# Patient Record
Sex: Male | Born: 1985 | Race: Black or African American | Hispanic: No | Marital: Married | Smoking: Never smoker
Health system: Southern US, Community
[De-identification: ages and names within clinical notes are randomized; demographics above are authoritative.]

## PROBLEM LIST (undated history)

## (undated) DIAGNOSIS — J45909 Unspecified asthma, uncomplicated: Secondary | ICD-10-CM

## (undated) DIAGNOSIS — I820 Budd-Chiari syndrome: Secondary | ICD-10-CM

## (undated) HISTORY — DX: Unspecified asthma, uncomplicated: J45.909

---

## 2013-05-13 ENCOUNTER — Encounter (HOSPITAL_COMMUNITY): Payer: Self-pay | Admitting: Emergency Medicine

## 2013-05-13 ENCOUNTER — Inpatient Hospital Stay (HOSPITAL_COMMUNITY)
Admission: EM | Admit: 2013-05-13 | Discharge: 2013-05-16 | DRG: 442 | Disposition: A | Discharge status: Home / Self Care | Payer: BC Managed Care – PPO | Attending: Family Medicine | Admitting: Family Medicine

## 2013-05-13 DIAGNOSIS — R1013 Epigastric pain: Secondary | ICD-10-CM

## 2013-05-13 DIAGNOSIS — E669 Obesity, unspecified: Secondary | ICD-10-CM | POA: Diagnosis present

## 2013-05-13 DIAGNOSIS — R109 Unspecified abdominal pain: Secondary | ICD-10-CM

## 2013-05-13 DIAGNOSIS — Z6841 Body Mass Index (BMI) 40.0 and over, adult: Secondary | ICD-10-CM

## 2013-05-13 DIAGNOSIS — I81 Portal vein thrombosis: Principal | ICD-10-CM | POA: Diagnosis present

## 2013-05-13 DIAGNOSIS — Z79899 Other long term (current) drug therapy: Secondary | ICD-10-CM

## 2013-05-13 DIAGNOSIS — I820 Budd-Chiari syndrome: Secondary | ICD-10-CM | POA: Diagnosis present

## 2013-05-13 LAB — CBC WITH DIFFERENTIAL/PLATELET
Basophils Absolute: 0 10*3/uL (ref 0.0–0.1)
Eosinophils Absolute: 0.2 10*3/uL (ref 0.0–0.7)
Eosinophils Relative: 2 % (ref 0–5)
Lymphs Abs: 2.9 10*3/uL (ref 0.7–4.0)
MCH: 31.5 pg (ref 26.0–34.0)
MCV: 89 fL (ref 78.0–100.0)
Neutro Abs: 4.8 10*3/uL (ref 1.7–7.7)
Neutrophils Relative %: 55 % (ref 43–77)
Platelets: 224 10*3/uL (ref 150–400)
RBC: 5.17 MIL/uL (ref 4.22–5.81)
RDW: 13.2 % (ref 11.5–15.5)
WBC: 8.8 10*3/uL (ref 4.0–10.5)

## 2013-05-13 LAB — URINALYSIS, ROUTINE W REFLEX MICROSCOPIC
Bilirubin Urine: NEGATIVE
Nitrite: NEGATIVE
Protein, ur: NEGATIVE mg/dL
Specific Gravity, Urine: 1.023 (ref 1.005–1.030)
Urobilinogen, UA: 0.2 mg/dL (ref 0.0–1.0)

## 2013-05-13 LAB — COMPREHENSIVE METABOLIC PANEL
ALT: 22 U/L (ref 0–53)
AST: 33 U/L (ref 0–37)
Albumin: 3.9 g/dL (ref 3.5–5.2)
Alkaline Phosphatase: 90 U/L (ref 39–117)
Calcium: 9.5 mg/dL (ref 8.4–10.5)
Glucose, Bld: 99 mg/dL (ref 70–99)
Potassium: 4.4 mEq/L (ref 3.5–5.1)
Sodium: 135 mEq/L (ref 135–145)
Total Bilirubin: 0.7 mg/dL (ref 0.3–1.2)
Total Protein: 8.3 g/dL (ref 6.0–8.3)

## 2013-05-13 LAB — LIPASE, BLOOD: Lipase: 49 U/L (ref 11–59)

## 2013-05-13 NOTE — ED Notes (Signed)
The pt is c/o abd pain for 3 days no n v or diarrhea

## 2013-05-13 NOTE — ED Provider Notes (Signed)
CSN: [id number]     Arrival date & time 05/13/13  2224 History   First MD Initiated Contact with Patient 05/13/13 2340     Chief Complaint  Patient presents with  . Abdominal Pain   (Consider location/radiation/quality/duration/timing/severity/associated sxs/prior Treatment) Patient is a 27 y.o. male presenting with abdominal pain. The history is provided by the patient and medical records. No language interpreter was used.  Abdominal Pain Associated symptoms: no chest pain, no constipation, no cough, no diarrhea, no dysuria, no fatigue, no fever, no hematuria, no nausea, no shortness of breath and no vomiting     LUCIA MCCREADIE is a 27 y.o. male  with no known medical or surgical hx presents to the Emergency Department complaining of gradual, persistent, progressively worsening abdominal pain onset 3 days ago. Associated symptoms include early satiety.  Patient has not attempted any over-the-counter treatments but lying flat makes it better and sitting and walking makes it worse.  Pt denies fever, chills, headache, neck pain, chest pain, SOB, nausea, vomiting, diarrhea, weakness, dizziness, syncope, dysuria, hematuria, testicular pain, penile pain or penile discharge. Patient denies change in eating habits; no sick contacts.   History reviewed. No pertinent past medical history. History reviewed. No pertinent past surgical history. No family history on file. History  Substance Use Topics  . Smoking status: Never Smoker   . Smokeless tobacco: Not on file  . Alcohol Use: No    Review of Systems  Constitutional: Negative for fever, diaphoresis, appetite change, fatigue and unexpected weight change.  HENT: Negative for mouth sores and trouble swallowing.   Respiratory: Negative for cough, chest tightness, shortness of breath, wheezing and stridor.   Cardiovascular: Negative for chest pain and palpitations.  Gastrointestinal: Positive for abdominal pain. Negative for nausea,  vomiting, diarrhea, constipation, blood in stool, abdominal distention and rectal pain.  Genitourinary: Negative for dysuria, urgency, frequency, hematuria, flank pain and difficulty urinating.  Musculoskeletal: Negative for back pain, neck pain and neck stiffness.  Skin: Negative for rash.  Neurological: Negative for weakness.  Hematological: Negative for adenopathy.  Psychiatric/Behavioral: Negative for confusion.  All other systems reviewed and are negative.    Allergies  Review of patient's allergies indicates no known allergies.  Home Medications   Current Outpatient Rx  Name  Route  Sig  Dispense  Refill  . omeprazole (PRILOSEC) 20 MG capsule   Oral   Take 1 capsule (20 mg total) by mouth daily.   30 capsule   0    BP 123/75  Pulse 85  Temp(Src) 98.2 F (36.8 C) (Oral)  Resp 15  Wt 314 lb 8 oz (142.656 kg)  SpO2 99% Physical Exam  Nursing note and vitals reviewed. Constitutional: He appears well-developed and well-nourished. No distress.  Awake, alert, nontoxic appearance  HENT:  Head: Normocephalic and atraumatic.  Mouth/Throat: Oropharynx is clear and moist. No oropharyngeal exudate.  Eyes: Conjunctivae are normal. No scleral icterus.  Neck: Normal range of motion. Neck supple.  Cardiovascular: Normal rate, regular rhythm, normal heart sounds and intact distal pulses.   No murmur heard. No tachycardia  Pulmonary/Chest: Effort normal and breath sounds normal. No respiratory distress. He has no wheezes.  Abdominal: Soft. Bowel sounds are normal. He exhibits no distension and no mass. There is tenderness in the epigastric area. There is no rebound, no guarding and no CVA tenderness.  Mild epigastric tenderness No rebound, guarding or peritoneal signs No pain with heel tap  Musculoskeletal: Normal range of motion. He exhibits no edema.  Neurological: He is alert.  Speech is clear and goal oriented Moves extremities without ataxia  Skin: Skin is warm and dry.  He is not diaphoretic.  Psychiatric: He has a normal mood and affect.    ED Course  Procedures (including critical care time) Labs Review Labs Reviewed  URINALYSIS, ROUTINE W REFLEX MICROSCOPIC - Abnormal; Notable for the following:    Color, Urine AMBER (*)    Ketones, ur 15 (*)    All other components within normal limits  CBC WITH DIFFERENTIAL  COMPREHENSIVE METABOLIC PANEL  LIPASE, BLOOD   Imaging Review No results found.  EKG Interpretation   None       MDM   1. Epigastric pain      Gianfranco N Waner presents with several days of epigastric pain.  On PE pt with mild ttp in the epigastrium but negative Murphy's sign.  Pt reports tolerating PO without difficulty and no emesis in the department.  Pt denies Hx of GERD or reflux symptoms.    12:53 AM Pt resting comfortably.  CT pending to rule out emergent condition.  Unlikely biliary in nature.  Pt declines pain control at this time.  Labs and PE reassuring.    Discussed with Fayrene Helper, PA-C who will follow and dispo.    Dahlia Client Ravyn Nikkel, PA-C 05/14/13 [phone]

## 2013-05-14 ENCOUNTER — Encounter (HOSPITAL_COMMUNITY): Payer: Self-pay | Admitting: Radiology

## 2013-05-14 ENCOUNTER — Other Ambulatory Visit: Payer: Self-pay | Admitting: Hematology and Oncology

## 2013-05-14 ENCOUNTER — Emergency Department (HOSPITAL_COMMUNITY): Payer: BC Managed Care – PPO

## 2013-05-14 DIAGNOSIS — I81 Portal vein thrombosis: Principal | ICD-10-CM | POA: Diagnosis present

## 2013-05-14 DIAGNOSIS — R1013 Epigastric pain: Secondary | ICD-10-CM

## 2013-05-14 DIAGNOSIS — R109 Unspecified abdominal pain: Secondary | ICD-10-CM | POA: Diagnosis present

## 2013-05-14 DIAGNOSIS — I820 Budd-Chiari syndrome: Secondary | ICD-10-CM

## 2013-05-14 LAB — HEPARIN LEVEL (UNFRACTIONATED): Heparin Unfractionated: 0.13 IU/mL — ABNORMAL LOW (ref 0.30–0.70)

## 2013-05-14 LAB — HOMOCYSTEINE: Homocysteine: 10.5 umol/L (ref 4.0–15.4)

## 2013-05-14 LAB — PROTIME-INR: INR: 1.03 (ref 0.00–1.49)

## 2013-05-14 MED ORDER — OMEPRAZOLE 20 MG PO CPDR: 20.0000 mg | DELAYED_RELEASE_CAPSULE | Freq: Every day | ORAL | Status: DC

## 2013-05-14 MED ORDER — HEPARIN BOLUS VIA INFUSION
6000.0000 [IU] | Freq: Once | INTRAVENOUS | Status: AC
  Administered 2013-05-14: 6000 [IU] via INTRAVENOUS
  Filled 2013-05-14: qty 6000

## 2013-05-14 MED ORDER — MORPHINE SULFATE 2 MG/ML IJ SOLN
2.0000 mg | INTRAMUSCULAR | Status: DC | PRN
  Administered 2013-05-14 (×3): 2 mg via INTRAVENOUS
  Filled 2013-05-14 (×3): qty 1

## 2013-05-14 MED ORDER — IOHEXOL 300 MG/ML  SOLN
100.0000 mL | Freq: Once | INTRAMUSCULAR | Status: AC | PRN
  Administered 2013-05-14: 100 mL via INTRAVENOUS

## 2013-05-14 MED ORDER — DEXTROSE-NACL 5-0.9 % IV SOLN
INTRAVENOUS | Status: DC
  Administered 2013-05-14 – 2013-05-15 (×4): via INTRAVENOUS

## 2013-05-14 MED ORDER — ONDANSETRON HCL 4 MG PO TABS: 4.0000 mg | ORAL_TABLET | Freq: Four times a day (QID) | ORAL | Status: DC | PRN

## 2013-05-14 MED ORDER — HEPARIN (PORCINE) IN NACL 100-0.45 UNIT/ML-% IJ SOLN
2000.0000 [IU]/h | INTRAMUSCULAR | Status: DC
  Administered 2013-05-14 (×2): 1600 [IU]/h via INTRAVENOUS
  Filled 2013-05-14 (×3): qty 250

## 2013-05-14 MED ORDER — ONDANSETRON HCL 4 MG/2ML IJ SOLN: 4.0000 mg | Freq: Four times a day (QID) | INTRAMUSCULAR | Status: DC | PRN

## 2013-05-14 MED ORDER — HEPARIN BOLUS VIA INFUSION
3000.0000 [IU] | Freq: Once | INTRAVENOUS | Status: AC
  Administered 2013-05-14: 3000 [IU] via INTRAVENOUS
  Filled 2013-05-14: qty 3000

## 2013-05-14 NOTE — Consult Note (Signed)
[facility] CONSULT NOTE CHIEF COMPLAINTS/PURPOSE OF CONSULTATION:  Acute portal vein thrombosis  HISTORY OF PRESENTING ILLNESS:  Ninfa Meeker Hoadley 27 y.o. male is here because of severe abdominal pain and was subsequently found to have portal vein thrombosis  This is an otherwise healthy gentleman was admitted after several days of mid epigastric to right upper quadrant pain. He felt that the pain might have been worse after a meal. He denies any change in bowel habits. No recent weight loss. He was subsequently seen in the emergency department and had CT scan evaluation which show evidence of left portal vein thrombosis. He is being admitted for anticoagulation therapy.  He denies recent history of trauma, long distance travel, dehydration, recent surgery, smoking or prolonged immobilization. He had no prior history or diagnosis of cancer. His age appropriate screening programs are up-to-date. He never had prior surgeries before. He denies prior history of pancreatitis The patient had never receive testosterone replacement therapy. There is no family history of blood clots.  MEDICAL HISTORY:  History reviewed. No pertinent past medical history.  SURGICAL HISTORY: History reviewed. No pertinent past surgical history.  SOCIAL HISTORY: History   Social History  . Marital Status: Married    Spouse Name: N/A    Number of Children: N/A  . Years of Education: N/A   Occupational History  . Not on file.   Social History Main Topics  . Smoking status: Never Smoker   . Smokeless tobacco: Not on file  . Alcohol Use: No  . Drug Use: Not on file  . Sexual Activity: Not on file   Other Topics Concern  . Not on file   Social History Narrative  . No narrative on file    FAMILY HISTORY: Denies family history of cancer or thrombosis ALLERGIES:  has No Known Allergies.  MEDICATIONS:  Current Facility-Administered Medications  Medication Dose Route Frequency  Provider Last Rate Last Dose  . dextrose 5 %-0.9 % sodium chloride infusion   Intravenous Continuous Houston Siren, MD 75 mL/hr at 05/14/13 0709    . heparin ADULT infusion 100 units/mL (25000 units/250 mL)  1,600 Units/hr Intravenous Continuous Abran Duke, RPH 16 mL/hr at 05/14/13 0743 1,600 Units/hr at 05/14/13 0743  . morphine 2 MG/ML injection 2 mg  2 mg Intravenous Q2H PRN Houston Siren, MD   2 mg at 05/14/13 1203  . ondansetron (ZOFRAN) tablet 4 mg  4 mg Oral Q6H PRN Houston Siren, MD       Or  . ondansetron [facility]) injection 4 mg  4 mg Intravenous Q6H PRN Houston Siren, MD        REVIEW OF SYSTEMS:   Constitutional: Denies fevers, chills or abnormal night sweats Eyes: Denies blurriness of vision, double vision or watery eyes Ears, nose, mouth, throat, and face: Denies mucositis or sore throat Respiratory: Denies cough, dyspnea or wheezes Cardiovascular: Denies palpitation, chest discomfort or lower extremity swelling Gastrointestinal:  Denies nausea, heartburn or change in bowel habits Skin: Denies abnormal skin rashes Lymphatics: Denies new lymphadenopathy or easy bruising Neurological:Denies numbness, tingling or new weaknesses Behavioral/Psych: Mood is stable, no new changes  All other systems were reviewed with the patient and are negative.  PHYSICAL EXAMINATION: ECOG PERFORMANCE STATUS: 1 - Symptomatic but completely ambulatory  Filed Vitals:   05/14/13 1515  BP: 121/82  Pulse: 74  Temp: 98.4 F (36.9 C)  Resp: 18   Filed Weights   05/13/13 2232 05/14/13 0653  Weight: 314 lb 8 oz (142.656  kg) 305 lb 5.4 oz (138.5 kg)    GENERAL:alert, no distress and comfortable SKIN: skin color, texture, turgor are normal, no rashes or significant lesions EYES: normal, conjunctiva are pink and non-injected, sclera clear OROPHARYNX:no exudate, no erythema and lips, buccal mucosa, and tongue normal  NECK: supple, thyroid normal size, non-tender, without nodularity LYMPH:  no palpable  lymphadenopathy in the cervical, axillary or inguinal LUNGS: clear to auscultation and percussion with normal breathing effort HEART: regular rate & rhythm and no murmurs and no lower extremity edema ABDOMEN:abdomen soft, mild tenderness in the epigastrium with normal bowel sounds Musculoskeletal:no cyanosis of digits and no clubbing  PSYCH: alert & oriented x 3 with fluent speech NEURO: no focal motor/sensory deficits  LABORATORY DATA:  I have reviewed the data as listed Recent Results (from the past 2160 hour(s))  CBC WITH DIFFERENTIAL     Status: None   Collection Time    05/13/13 10:32 PM      Result Value Range   WBC 8.8  4.0 - 10.5 K/uL   RBC 5.17  4.22 - 5.81 MIL/uL   Hemoglobin 16.3  13.0 - 17.0 g/dL   HCT 01.0  27.2 - 53.6 %   MCV 89.0  78.0 - 100.0 fL   MCH 31.5  26.0 - 34.0 pg   MCHC 35.4  30.0 - 36.0 g/dL   RDW 64.4  03.4 - 74.2 %   Platelets 224  150 - 400 K/uL   Neutrophils Relative % 55  43 - 77 %   Neutro Abs 4.8  1.7 - 7.7 K/uL   Lymphocytes Relative 33  12 - 46 %   Lymphs Abs 2.9  0.7 - 4.0 K/uL   Monocytes Relative 10  3 - 12 %   Monocytes Absolute 0.9  0.1 - 1.0 K/uL   Eosinophils Relative 2  0 - 5 %   Eosinophils Absolute 0.2  0.0 - 0.7 K/uL   Basophils Relative 0  0 - 1 %   Basophils Absolute 0.0  0.0 - 0.1 K/uL  COMPREHENSIVE METABOLIC PANEL     Status: None   Collection Time    05/13/13 10:32 PM      Result Value Range   Sodium 135  135 - 145 mEq/L   Potassium 4.4  3.5 - 5.1 mEq/L   Chloride 98  96 - 112 mEq/L   CO2 28  19 - 32 mEq/L   Glucose, Bld 99  70 - 99 mg/dL   BUN 8  6 - 23 mg/dL   Creatinine, Ser 5.95  0.50 - 1.35 mg/dL   Calcium 9.5  8.4 - 63.8 mg/dL   Total Protein 8.3  6.0 - 8.3 g/dL   Albumin 3.9  3.5 - 5.2 g/dL   AST 33  0 - 37 U/L   ALT 22  0 - 53 U/L   Alkaline Phosphatase 90  39 - 117 U/L   Total Bilirubin 0.7  0.3 - 1.2 mg/dL   GFR calc non Af Amer >90  >90 mL/min   GFR calc Af Amer >90  >90 mL/min   Comment: (NOTE)      The eGFR has been calculated using the CKD EPI equation.     This calculation has not been validated in all clinical situations.     eGFR's persistently <90 mL/min signify possible Chronic Kidney     Disease.  LIPASE, BLOOD     Status: None   Collection Time    05/13/13 10:32  PM      Result Value Range   Lipase 49  11 - 59 U/L  URINALYSIS, ROUTINE W REFLEX MICROSCOPIC     Status: Abnormal   Collection Time    05/13/13 10:36 PM      Result Value Range   Color, Urine AMBER (*) YELLOW   Comment: BIOCHEMICALS MAY BE AFFECTED BY COLOR   APPearance CLEAR  CLEAR   Specific Gravity, Urine 1.023  1.005 - 1.030   pH 5.5  5.0 - 8.0   Glucose, UA NEGATIVE  NEGATIVE mg/dL   Hgb urine dipstick NEGATIVE  NEGATIVE   Bilirubin Urine NEGATIVE  NEGATIVE   Ketones, ur 15 (*) NEGATIVE mg/dL   Protein, ur NEGATIVE  NEGATIVE mg/dL   Urobilinogen, UA 0.2  0.0 - 1.0 mg/dL   Nitrite NEGATIVE  NEGATIVE   Leukocytes, UA NEGATIVE  NEGATIVE   Comment: MICROSCOPIC NOT DONE ON URINES WITH NEGATIVE PROTEIN, BLOOD, LEUKOCYTES, NITRITE, OR GLUCOSE <1000 mg/dL.  PROTIME-INR     Status: None   Collection Time    05/14/13  5:21 AM      Result Value Range   Prothrombin Time 13.3  11.6 - 15.2 seconds   INR 1.03  0.00 - 1.49  ANTITHROMBIN III     Status: None   Collection Time    05/14/13  5:30 AM      Result Value Range   AntiThromb III Func 99  75 - 120 %  HOMOCYSTEINE     Status: None   Collection Time    05/14/13  5:30 AM      Result Value Range   Homocysteine 10.5  4.0 - 15.4 umol/L   Comment: Performed at [facility]    RADIOGRAPHIC STUDIES: I have personally reviewed the radiological images as listed and agreed with the findings in the report. Ct Abdomen Pelvis W Contrast  05/14/2013   CLINICAL DATA:  Abdominal pain for 3 days.  EXAM: CT ABDOMEN AND PELVIS WITH CONTRAST  TECHNIQUE: Multidetector CT imaging of the abdomen and pelvis was performed using the standard protocol following bolus  administration of intravenous contrast.  CONTRAST:  OMNIPAQUE IOHEXOL 300 MG/ML  SOLN  COMPARISON:  None.  FINDINGS: Minimal bibasilar atelectasis is noted.  There appears to be occlusion of the left portal vein, with associated inflammation and edema, and persistent increased enhancement of the left hepatic lobe. The right portal vein and remainder of the portal venous system appears intact. The liver is otherwise grossly unremarkable. The spleen is within normal limits.  The gallbladder is within normal limits. The pancreas and adrenal glands are unremarkable.  The kidneys are unremarkable in appearance. There is no evidence of hydronephrosis. No renal or ureteral stones are seen. No perinephric stranding is appreciated.  No free fluid is identified. The small bowel is unremarkable in appearance. The stomach is within normal limits. No acute vascular abnormalities are seen. Periaortic nodes are borderline normal in size. Two left-sided renal arteries are noted.  The appendix is normal in caliber, without evidence for appendicitis. The colon is unremarkable in appearance.  The bladder is mildly distended and grossly unremarkable. The prostate remains normal in size. No inguinal lymphadenopathy is seen.  No acute osseous abnormalities are identified.  IMPRESSION: 1. Occlusion of the left portal vein, with associated inflammation and edema, and persistent increased enhancement of the left hepatic lobe. 2. Otherwise unremarkable CT of the abdomen and pelvis.  These results were called by telephone at the time  of interpretation on 05/14/2013 at 2:29 AM to Dr. Ranae Palms, who verbally acknowledged these results.   Electronically Signed   By: Roanna Raider M.D.   On: 05/14/2013 02:34    ASSESSMENT:  Acute DVT  PLAN:  I reviewed with the patient about the plan for care for acute, unprovoked DVT.  We discussed about the pros and cons about testing for thrombophilia disorder. Workup is in progress. I will  add on a few additional tests to rule primary hematological disorders that can cause portal vein thrombosis  We discussed about various options of anticoagulation therapies including warfarin, low molecular weight heparin such as Lovenox or newer agents such as Rivaroxaban. Some of the risks and benefits discussed including costs involved, the need for monitoring, risks of life-threatening bleeding/hospitalization, reversibility of each agent in the event of bleeding or overdose, safety profile of each drug and taking into account other social issues such as ease of administration of medications, etc. Ultimately, we have made an informed decision for the patient to continue his treatment with IV heparin tonight and to switch over to oral anticoagulation therapy tomorrow.  The patient does not have a primary care provider. If he choose to go with warfarin, he could potentially be discharged with Lovenox injection for 5 days overlap with warfarin at 5 mg dose daily. I could bring him back to my clinic next Monday to have his INR checked and take care of his anticoagulation therapy. Goal INR is 2-3. If he elects to go with Rivaroxaban, he does not need anticoagulation monitoring and in that situation I will just see him back in the clinic in one month to followup on test results. The duration of recommended anticoagulation therapy is minimum 6 months.  Finally, at the end of our consultation today, I reinforced the importance of preventive strategies such as avoiding hormonal supplement, avoiding cigarette smoking, keeping up-to-date with screening programs for early cancer detection, frequent ambulation for long distance travel and aggressive DVT prophylaxis in all surgical settings.  I will make appointment for him to follow-up in my clinic in 1 month to review test results.  All questions were answered. The patient knows to call the clinic with any problems, questions or concerns.    Angelis Gates,  MD @T @ 6:42 PM

## 2013-05-14 NOTE — ED Provider Notes (Signed)
Epigastric pain, awaits CT to r/o acute emergent condition.  Labs are otherwise unremarkable.    3:55 AM abd CT with evidence of occlusion of the L portal vein with associated inflammation and edema, and persistent increased enhancement of the L hepatic lobe.    I discussed result with Dr. Ranae Palms.  I also consulted with [facility], Dr. Conley Rolls who will see and admit pt for further care.  Pt is aware of finding and agrees with plan.    BP 123/81  Pulse 71  Temp(Src) 97.9 F (36.6 C) (Oral)  Resp 14  Wt 314 lb 8 oz (142.656 kg)  SpO2 99%  I have reviewed nursing notes and vital signs. I personally reviewed the imaging tests through PACS system  I reviewed available ER/hospitalization records thought the EMR  Results for orders placed during the hospital encounter of 05/13/13  URINALYSIS, ROUTINE W REFLEX MICROSCOPIC      Result Value Range   Color, Urine AMBER (*) YELLOW   APPearance CLEAR  CLEAR   Specific Gravity, Urine 1.023  1.005 - 1.030   pH 5.5  5.0 - 8.0   Glucose, UA NEGATIVE  NEGATIVE mg/dL   Hgb urine dipstick NEGATIVE  NEGATIVE   Bilirubin Urine NEGATIVE  NEGATIVE   Ketones, ur 15 (*) NEGATIVE mg/dL   Protein, ur NEGATIVE  NEGATIVE mg/dL   Urobilinogen, UA 0.2  0.0 - 1.0 mg/dL   Nitrite NEGATIVE  NEGATIVE   Leukocytes, UA NEGATIVE  NEGATIVE  CBC WITH DIFFERENTIAL      Result Value Range   WBC 8.8  4.0 - 10.5 K/uL   RBC 5.17  4.22 - 5.81 MIL/uL   Hemoglobin 16.3  13.0 - 17.0 g/dL   HCT 28.4  13.2 - 44.0 %   MCV 89.0  78.0 - 100.0 fL   MCH 31.5  26.0 - 34.0 pg   MCHC 35.4  30.0 - 36.0 g/dL   RDW 10.2  72.5 - 36.6 %   Platelets 224  150 - 400 K/uL   Neutrophils Relative % 55  43 - 77 %   Neutro Abs 4.8  1.7 - 7.7 K/uL   Lymphocytes Relative 33  12 - 46 %   Lymphs Abs 2.9  0.7 - 4.0 K/uL   Monocytes Relative 10  3 - 12 %   Monocytes Absolute 0.9  0.1 - 1.0 K/uL   Eosinophils Relative 2  0 - 5 %   Eosinophils Absolute 0.2  0.0 - 0.7 K/uL   Basophils  Relative 0  0 - 1 %   Basophils Absolute 0.0  0.0 - 0.1 K/uL  COMPREHENSIVE METABOLIC PANEL      Result Value Range   Sodium 135  135 - 145 mEq/L   Potassium 4.4  3.5 - 5.1 mEq/L   Chloride 98  96 - 112 mEq/L   CO2 28  19 - 32 mEq/L   Glucose, Bld 99  70 - 99 mg/dL   BUN 8  6 - 23 mg/dL   Creatinine, Ser 4.40  0.50 - 1.35 mg/dL   Calcium 9.5  8.4 - 34.7 mg/dL   Total Protein 8.3  6.0 - 8.3 g/dL   Albumin 3.9  3.5 - 5.2 g/dL   AST 33  0 - 37 U/L   ALT 22  0 - 53 U/L   Alkaline Phosphatase 90  39 - 117 U/L   Total Bilirubin 0.7  0.3 - 1.2 mg/dL   GFR calc non Af Amer >  90  >90 mL/min   GFR calc Af Amer >90  >90 mL/min  LIPASE, BLOOD      Result Value Range   Lipase 49  11 - 59 U/L   Ct Abdomen Pelvis W Contrast  05/14/2013   CLINICAL DATA:  Abdominal pain for 3 days.  EXAM: CT ABDOMEN AND PELVIS WITH CONTRAST  TECHNIQUE: Multidetector CT imaging of the abdomen and pelvis was performed using the standard protocol following bolus administration of intravenous contrast.  CONTRAST:  OMNIPAQUE IOHEXOL 300 MG/ML  SOLN  COMPARISON:  None.  FINDINGS: Minimal bibasilar atelectasis is noted.  There appears to be occlusion of the left portal vein, with associated inflammation and edema, and persistent increased enhancement of the left hepatic lobe. The right portal vein and remainder of the portal venous system appears intact. The liver is otherwise grossly unremarkable. The spleen is within normal limits.  The gallbladder is within normal limits. The pancreas and adrenal glands are unremarkable.  The kidneys are unremarkable in appearance. There is no evidence of hydronephrosis. No renal or ureteral stones are seen. No perinephric stranding is appreciated.  No free fluid is identified. The small bowel is unremarkable in appearance. The stomach is within normal limits. No acute vascular abnormalities are seen. Periaortic nodes are borderline normal in size. Two left-sided renal arteries are noted.   The appendix is normal in caliber, without evidence for appendicitis. The colon is unremarkable in appearance.  The bladder is mildly distended and grossly unremarkable. The prostate remains normal in size. No inguinal lymphadenopathy is seen.  No acute osseous abnormalities are identified.  IMPRESSION: 1. Occlusion of the left portal vein, with associated inflammation and edema, and persistent increased enhancement of the left hepatic lobe. 2. Otherwise unremarkable CT of the abdomen and pelvis.  These results were called by telephone at the time of interpretation on 05/14/2013 at 2:29 AM to Dr. Ranae Palms, who verbally acknowledged these results.   Electronically Signed   By: Roanna Raider M.D.   On: 05/14/2013 02:34      Fayrene Helper, PA-C 05/14/13 [phone]

## 2013-05-14 NOTE — H&P (Signed)
[facility] History and Physical  JEFFREN DOMBEK ZOX:[id number] DOB: 1985/10/31    PCP:   NONE.  Chief Complaint: abdominal pain for 4 days.  HPI: Vincent Mckinney is an 27 y.o. male with benign past medical history on no chronic medication, presents to the ER with abdominal pain for 4 days.  It has not been severe, and not progressive.  Pain is constant and nonradiating, mainly in the epigastrium area. No melena.  He has had no nausea, vomiting, fever or chills.  BM has been normal. He has no distant travel, recent surgery, ill contact, trauma or previous abdominal surgery.  Evalution in the ER included a CT scan with contrast of the abdo/pelvis which showed a left hepatic vein thrombosis with associated left lobe edema.  His serology was normal including WBC, Hb, LFTs, renal fx test, and Lipase.  Hospitalist was asked to admit him for further evaluation and treatment.  Rewiew of Systems:  Constitutional: Negative for malaise, fever and chills. No significant weight loss or weight gain Eyes: Negative for eye pain, redness and discharge, diplopia, visual changes, or flashes of light. ENMT: Negative for ear pain, hoarseness, nasal congestion, sinus pressure and sore throat. No headaches; tinnitus, drooling, or problem swallowing. Cardiovascular: Negative for chest pain, palpitations, diaphoresis, dyspnea and peripheral edema. ; No orthopnea, PND Respiratory: Negative for cough, hemoptysis, wheezing and stridor. No pleuritic chestpain. Gastrointestinal: Negative for nausea, vomiting, diarrhea, constipation,  melena, blood in stool, hematemesis, jaundice and rectal bleeding.    Genitourinary: Negative for frequency, dysuria, incontinence,flank pain and hematuria; Musculoskeletal: Negative for back pain and neck pain. Negative for swelling and trauma.;  Skin: . Negative for pruritus, rash, abrasions, bruising and skin lesion.; ulcerations Neuro: Negative for headache, lightheadedness  and neck stiffness. Negative for weakness, altered level of consciousness , altered mental status, extremity weakness, burning feet, involuntary movement, seizure and syncope.  Psych: negative for anxiety, depression, insomnia, tearfulness, panic attacks, hallucinations, paranoia, suicidal or homicidal ideation.   History reviewed. No pertinent past medical history.  History reviewed. No pertinent past surgical history.  Medications:  HOME MEDS: Prior to Admission medications   Medication Sig Start Date End Date Taking? Authorizing Provider  omeprazole (PRILOSEC) 20 MG capsule Take 1 capsule (20 mg total) by mouth daily. 05/14/13   Hannah Muthersbaugh, PA-C     Allergies:  No Known Allergies  Social History:   reports that he has never smoked. He does not have any smokeless tobacco history on file. He reports that he does not drink alcohol. His drug history is not on file.  Family History: No family hx of thromboembolic disease.   Physical Exam: Filed Vitals:   05/13/13 2232 05/13/13 2341 05/14/13 0246  BP: 134/86 123/75 123/81  Pulse: 89 85 71  Temp: 99.6 F (37.6 C) 98.2 F (36.8 C) 97.9 F (36.6 C)  TempSrc: Oral Oral Oral  Resp: 18 15 14   Weight: 142.656 kg (314 lb 8 oz)    SpO2: 96% 99% 99%   Blood pressure 123/81, pulse 71, temperature 97.9 F (36.6 C), temperature source Oral, resp. rate 14, weight 142.656 kg (314 lb 8 oz), SpO2 99.00%.  GEN:  Pleasant  patient lying in the stretcher in no acute distress; cooperative with exam. PSYCH:  alert and oriented x4; does not appear anxious or depressed; affect is appropriate. HEENT: Mucous membranes pink and anicteric; PERRLA; EOM intact; no cervical lymphadenopathy nor thyromegaly or carotid bruit; no JVD; There were no stridor. Neck is very supple.  Breasts:: Not examined CHEST WALL: No tenderness CHEST: Normal respiration, clear to auscultation bilaterally.  HEART: Regular rate and rhythm.  There are no murmur, rub,  or gallops.   BACK: No kyphosis or scoliosis; no CVA tenderness ABDOMEN: soft and slightly tender to the epigastric area. no masses, no organomegaly, normal abdominal bowel sounds; no pannus; no intertriginous candida. There is no rebound and no distention. No ascites. Rectal Exam: Not done EXTREMITIES: No bone or joint deformity; age-appropriate arthropathy of the hands and knees; no edema; no ulcerations.  There is no calf tenderness. Genitalia: not examined PULSES: 2+ and symmetric SKIN: Normal hydration no rash or ulceration CNS: Cranial nerves 2-12 grossly intact no focal lateralizing neurologic deficit.  Speech is fluent; uvula elevated with phonation, facial symmetry and tongue midline. DTR are normal bilaterally, cerebella exam is intact, barbinski is negative and strengths are equaled bilaterally.  No sensory loss.   Labs on Admission:  Basic Metabolic Panel:  Recent Labs Lab 05/13/13 2232  NA 135  K 4.4  CL 98  CO2 28  GLUCOSE 99  BUN 8  CREATININE 1.04  CALCIUM 9.5   Liver Function Tests:  Recent Labs Lab 05/13/13 2232  AST 33  ALT 22  ALKPHOS 90  BILITOT 0.7  PROT 8.3  ALBUMIN 3.9    Recent Labs Lab 05/13/13 2232  LIPASE 49   No results found for this basename: AMMONIA,  in the last 168 hours CBC:  Recent Labs Lab 05/13/13 2232  WBC 8.8  NEUTROABS 4.8  HGB 16.3  HCT 46.0  MCV 89.0  PLT 224   Cardiac Enzymes: No results found for this basename: CKTOTAL, CKMB, CKMBINDEX, TROPONINI,  in the last 168 hours  CBG: No results found for this basename: GLUCAP,  in the last 168 hours   Radiological Exams on Admission: Ct Abdomen Pelvis W Contrast  05/14/2013   CLINICAL DATA:  Abdominal pain for 3 days.  EXAM: CT ABDOMEN AND PELVIS WITH CONTRAST  TECHNIQUE: Multidetector CT imaging of the abdomen and pelvis was performed using the standard protocol following bolus administration of intravenous contrast.  CONTRAST:  OMNIPAQUE IOHEXOL 300 MG/ML   SOLN  COMPARISON:  None.  FINDINGS: Minimal bibasilar atelectasis is noted.  There appears to be occlusion of the left portal vein, with associated inflammation and edema, and persistent increased enhancement of the left hepatic lobe. The right portal vein and remainder of the portal venous system appears intact. The liver is otherwise grossly unremarkable. The spleen is within normal limits.  The gallbladder is within normal limits. The pancreas and adrenal glands are unremarkable.  The kidneys are unremarkable in appearance. There is no evidence of hydronephrosis. No renal or ureteral stones are seen. No perinephric stranding is appreciated.  No free fluid is identified. The small bowel is unremarkable in appearance. The stomach is within normal limits. No acute vascular abnormalities are seen. Periaortic nodes are borderline normal in size. Two left-sided renal arteries are noted.  The appendix is normal in caliber, without evidence for appendicitis. The colon is unremarkable in appearance.  The bladder is mildly distended and grossly unremarkable. The prostate remains normal in size. No inguinal lymphadenopathy is seen.  No acute osseous abnormalities are identified.  IMPRESSION: 1. Occlusion of the left portal vein, with associated inflammation and edema, and persistent increased enhancement of the left hepatic lobe. 2. Otherwise unremarkable CT of the abdomen and pelvis.  These results were called by telephone at the time of interpretation on  05/14/2013 at 2:29 AM to Dr. Ranae Palms, who verbally acknowledged these results.   Electronically Signed   By: Roanna Raider M.D.   On: 05/14/2013 02:34   Assessment/Plan Present on Admission:  . Thrombosis, hepatic vein . Abdominal pain . Hepatic vein thrombosis  PLAN:  Will admit him for hepatic vein thrombosis.  He has no ascites, peripheral stigmata for liver disease, and no elevation of liver fx tests.  Unclear exact etiology.  Will begin with IV heparin as  he likely has no esophageal varices.  I will initiate thrombophilic work up given no definite inciting etiology.  He has no family history of thromboembolic disease.  Please consult GI for further recommendation regarding thrombolysis or stent placement.  He is stable, full code, and will be admitted to [facility] service.  Thank you for asking me to participate in his care.   Other plans as per orders.  Code Status: FULL Unk Lightning, MD. [facility] Pager [phone] 7pm to 7am.  05/14/2013, 4:57 AM

## 2013-05-14 NOTE — Progress Notes (Addendum)
Patient seen and evaluated earlier this AM by my associate. Please refer to his H and P for details regarding assessment and plan.  I have consulted GI doctors on call 05/14/13 for further recommendations regarding his Left Addendum: portal vein thrombosis associated with left lobe edema.  Hypercoagulable panel ordered and patient started on heparin.  Will reassess next am.  Penny Pia

## 2013-05-14 NOTE — Progress Notes (Signed)
Patient transported to 6N22 from Emergency Department.  Family at bedside.  VSS and patient in no acute distress.  Patient received 2mg  Morphine for pain. Oriented to room/unit and call bell within reach.  Will continue to monitor.

## 2013-05-14 NOTE — Progress Notes (Signed)
ANTICOAGULATION CONSULT NOTE - Initial Consult  Pharmacy Consult for Heparin  Indication: Portal Vein Thrombosis  No Known Allergies  Patient Measurements: Height: 6' (182.9 cm) Weight: 305 lb 5.4 oz (138.5 kg) IBW/kg (Calculated) : 77.6 Heparin Dosing Weight: ~109 kg  Vital Signs: Temp: 98.4 F (36.9 C) (12/24 0653) Temp src: Oral (12/24 0653) BP: 130/77 mmHg (12/24 0653) Pulse Rate: 82 (12/24 0653)  Labs:  Recent Labs  05/13/13 2232  HGB 16.3  HCT 46.0  PLT 224  CREATININE 1.04   Estimated Creatinine Clearance: 155.3 ml/min (by C-G formula based on Cr of 1.04).  Assessment: 27 y/o M to start heparin per pharmacy for likely portal vein thrombosis after CT Abdomen shows left portal vein occlusion with inflammation and edema. Labs as above. No meds PTA.   Goal of Therapy:  Heparin level 0.3-0.7 units/ml Monitor platelets by anticoagulation protocol: Yes   Plan:  -Heparin 6000 units BOLUS -Start heparin drip at 1600 units/hr -6 hour HL at 1400 -Daily CBC/HL  Thank you for allowing me to take part in this patient's care,  Abran Duke, PharmD Clinical Pharmacist Phone: [phone] Pager: [phone] 05/14/2013 7:07 AM

## 2013-05-14 NOTE — Consult Note (Signed)
Unassigned Patient  Reason for Consult: Left Portal Vein Thrombosis Referring Physician: [facility].  Vincent Mckinney HPI: This is a 27 year old male admitted for RUQ pain.  His pain started this past Saturday and he thought that it was secondary to gas.  He cleansed his colon with an over-the-counter colon cleanse, but this did not improve his symptoms.  No reports of hematochezia or melena with the bowel movements.  Over the weekend his pain intensified, but the improved.  However, last evening it markedly worsened. He presented to the ER and he was identified to have a left portal vein thrombosis.  There is no history of ETOH abuse, illicit drug use, family history of malignancies, or family history of hematologic disorders.  The patient is a nonsmoker.  As a result of the finding a GI consultation was requested.  History reviewed. No pertinent past medical history.  History reviewed. No pertinent past surgical history.  No family history on file.  Social History:  reports that he has never smoked. He does not have any smokeless tobacco history on file. He reports that he does not drink alcohol. His drug history is not on file.  Allergies: No Known Allergies  Medications:  Scheduled:  Continuous: . dextrose 5 % and 0.9% NaCl 75 mL/hr at 05/14/13 0709  . heparin 1,600 Units/hr (05/14/13 0743)    Results for orders placed during the hospital encounter of 05/13/13 (from the past 24 hour(s))  CBC WITH DIFFERENTIAL     Status: None   Collection Time    05/13/13 10:32 PM      Result Value Range   WBC 8.8  4.0 - 10.5 K/uL   RBC 5.17  4.22 - 5.81 MIL/uL   Hemoglobin 16.3  13.0 - 17.0 g/dL   HCT 98.1  19.1 - 47.8 %   MCV 89.0  78.0 - 100.0 fL   MCH 31.5  26.0 - 34.0 pg   MCHC 35.4  30.0 - 36.0 g/dL   RDW 29.5  62.1 - 30.8 %   Platelets 224  150 - 400 K/uL   Neutrophils Relative % 55  43 - 77 %   Neutro Abs 4.8  1.7 - 7.7 K/uL   Lymphocytes Relative 33  12 - 46 %    Lymphs Abs 2.9  0.7 - 4.0 K/uL   Monocytes Relative 10  3 - 12 %   Monocytes Absolute 0.9  0.1 - 1.0 K/uL   Eosinophils Relative 2  0 - 5 %   Eosinophils Absolute 0.2  0.0 - 0.7 K/uL   Basophils Relative 0  0 - 1 %   Basophils Absolute 0.0  0.0 - 0.1 K/uL  COMPREHENSIVE METABOLIC PANEL     Status: None   Collection Time    05/13/13 10:32 PM      Result Value Range   Sodium 135  135 - 145 mEq/L   Potassium 4.4  3.5 - 5.1 mEq/L   Chloride 98  96 - 112 mEq/L   CO2 28  19 - 32 mEq/L   Glucose, Bld 99  70 - 99 mg/dL   BUN 8  6 - 23 mg/dL   Creatinine, Ser 6.57  0.50 - 1.35 mg/dL   Calcium 9.5  8.4 - 84.6 mg/dL   Total Protein 8.3  6.0 - 8.3 g/dL   Albumin 3.9  3.5 - 5.2 g/dL   AST 33  0 - 37 U/L   ALT 22  0 - 53  U/L   Alkaline Phosphatase 90  39 - 117 U/L   Total Bilirubin 0.7  0.3 - 1.2 mg/dL   GFR calc non Af Amer >90  >90 mL/min   GFR calc Af Amer >90  >90 mL/min  LIPASE, BLOOD     Status: None   Collection Time    05/13/13 10:32 PM      Result Value Range   Lipase 49  11 - 59 U/L  URINALYSIS, ROUTINE W REFLEX MICROSCOPIC     Status: Abnormal   Collection Time    05/13/13 10:36 PM      Result Value Range   Color, Urine AMBER (*) YELLOW   APPearance CLEAR  CLEAR   Specific Gravity, Urine 1.023  1.005 - 1.030   pH 5.5  5.0 - 8.0   Glucose, UA NEGATIVE  NEGATIVE mg/dL   Hgb urine dipstick NEGATIVE  NEGATIVE   Bilirubin Urine NEGATIVE  NEGATIVE   Ketones, ur 15 (*) NEGATIVE mg/dL   Protein, ur NEGATIVE  NEGATIVE mg/dL   Urobilinogen, UA 0.2  0.0 - 1.0 mg/dL   Nitrite NEGATIVE  NEGATIVE   Leukocytes, UA NEGATIVE  NEGATIVE  PROTIME-INR     Status: None   Collection Time    05/14/13  5:21 AM      Result Value Range   Prothrombin Time 13.3  11.6 - 15.2 seconds   INR 1.03  0.00 - 1.49  ANTITHROMBIN III     Status: None   Collection Time    05/14/13  5:30 AM      Result Value Range   AntiThromb III Func 99  75 - 120 %  HOMOCYSTEINE     Status: None   Collection Time     05/14/13  5:30 AM      Result Value Range   Homocysteine 10.5  4.0 - 15.4 umol/L     Ct Abdomen Pelvis W Contrast  05/14/2013   CLINICAL DATA:  Abdominal pain for 3 days.  EXAM: CT ABDOMEN AND PELVIS WITH CONTRAST  TECHNIQUE: Multidetector CT imaging of the abdomen and pelvis was performed using the standard protocol following bolus administration of intravenous contrast.  CONTRAST:  OMNIPAQUE IOHEXOL 300 MG/ML  SOLN  COMPARISON:  None.  FINDINGS: Minimal bibasilar atelectasis is noted.  There appears to be occlusion of the left portal vein, with associated inflammation and edema, and persistent increased enhancement of the left hepatic lobe. The right portal vein and remainder of the portal venous system appears intact. The liver is otherwise grossly unremarkable. The spleen is within normal limits.  The gallbladder is within normal limits. The pancreas and adrenal glands are unremarkable.  The kidneys are unremarkable in appearance. There is no evidence of hydronephrosis. No renal or ureteral stones are seen. No perinephric stranding is appreciated.  No free fluid is identified. The small bowel is unremarkable in appearance. The stomach is within normal limits. No acute vascular abnormalities are seen. Periaortic nodes are borderline normal in size. Two left-sided renal arteries are noted.  The appendix is normal in caliber, without evidence for appendicitis. The colon is unremarkable in appearance.  The bladder is mildly distended and grossly unremarkable. The prostate remains normal in size. No inguinal lymphadenopathy is seen.  No acute osseous abnormalities are identified.  IMPRESSION: 1. Occlusion of the left portal vein, with associated inflammation and edema, and persistent increased enhancement of the left hepatic lobe. 2. Otherwise unremarkable CT of the abdomen and pelvis.  These results  were called by telephone at the time of interpretation on 05/14/2013 at 2:29 AM to Dr. Ranae Palms, who  verbally acknowledged these results.   Electronically Signed   By: Roanna Raider M.D.   On: 05/14/2013 02:34    ROS:  As stated above in the HPI otherwise negative.  Blood pressure 130/77, pulse 82, temperature 98.4 F (36.9 C), temperature source Oral, resp. rate 15, height 6' (1.829 m), weight 305 lb 5.4 oz (138.5 kg), SpO2 99.00%.    PE: Gen: NAD, Alert and Oriented HEENT:  [place]/AT, EOMI Neck: Supple, no LAD Lungs: CTA Bilaterally CV: RRR without M/G/R ABM: Soft, tender in the RUQ, +BS Ext: No C/C/E  Assessment/Plan: 1) Left portal vein thrombosis. 2) RUQ abdominal pain. 3) Obesity.   I am unable to identify a source for his thrombosis.  There is no evidence of an acute pancreatitis on the scan.  No recent medications.  There is no overt evidence of a malignancy.  He may have a hypercoagulable state.  Plan: 1) Hematology consultation. 2) Okay to advance diet. 3) Continue with Heparin. 4) No GI intervention required at this time, but call if my assistance is required again.  Chrisma Hurlock D 05/14/2013, 3:36 PM

## 2013-05-14 NOTE — Progress Notes (Addendum)
ANTICOAGULATION CONSULT NOTE - Follow UP Consult  Pharmacy Consult for Heparin  Indication: Portal Vein Thrombosis  No Known Allergies  Patient Measurements: Height: 6' (182.9 cm) Weight: 305 lb 5.4 oz (138.5 kg) IBW/kg (Calculated) : 77.6 Heparin Dosing Weight: ~109 kg  Vital Signs: Temp: 98.3 F (36.8 C) (12/24 2141) Temp src: Oral (12/24 2141) BP: 110/62 mmHg (12/24 2141) Pulse Rate: 77 (12/24 2141)  Labs:  Recent Labs  05/13/13 2232 05/14/13 0521 05/14/13 1839  HGB 16.3  --   --   HCT 46.0  --   --   PLT 224  --   --   LABPROT  --  13.3  --   INR  --  1.03  --   HEPARINUNFRC  --   --  0.13*  CREATININE 1.04  --   --    Estimated Creatinine Clearance: 155.3 ml/min (by C-G formula based on Cr of 1.04).  Assessment: 27 y/o M to start heparin per pharmacy for likely portal vein thrombosis after CT Abdomen shows left portal vein occlusion with inflammation and edema.  Heparin drip 1600 uts/hr HL 0.13 less than goal.  Goal of Therapy:  Heparin level 0.3-0.7 units/ml Monitor platelets by anticoagulation protocol: Yes   Plan:  Heparin bolus 3000 uts IV x1  Increase heparin drip 2000 uts/hr  Daily HL, CBC  Leota Sauers Pharm.D. CPP, BCPS Clinical Pharmacist [phone] 05/14/2013 10:57 PM

## 2013-05-14 NOTE — Progress Notes (Signed)
Asked pt. If he had the flu shot this year.  Pt. Stated he had not and does not want to receive it.  Will continue to monitor. Vanice Sarah

## 2013-05-15 DIAGNOSIS — R109 Unspecified abdominal pain: Secondary | ICD-10-CM

## 2013-05-15 LAB — CBC
Hemoglobin: 14.9 g/dL (ref 13.0–17.0)
RBC: 4.86 MIL/uL (ref 4.22–5.81)
RDW: 13.3 % (ref 11.5–15.5)

## 2013-05-15 LAB — PROTEIN C ACTIVITY: Protein C Activity: 125 % (ref 75–133)

## 2013-05-15 LAB — LUPUS ANTICOAGULANT PANEL: PTT Lupus Anticoagulant: 42.7 secs (ref 28.0–43.0)

## 2013-05-15 LAB — PROTEIN S ACTIVITY: Protein S Activity: 71 % (ref 69–129)

## 2013-05-15 LAB — HEPARIN LEVEL (UNFRACTIONATED): Heparin Unfractionated: 0.85 IU/mL — ABNORMAL HIGH (ref 0.30–0.70)

## 2013-05-15 MED ORDER — HEPARIN (PORCINE) IN NACL 100-0.45 UNIT/ML-% IJ SOLN
1800.0000 [IU]/h | INTRAMUSCULAR | Status: DC
  Administered 2013-05-15: 1800 [IU]/h via INTRAVENOUS
  Filled 2013-05-15 (×2): qty 250

## 2013-05-15 MED ORDER — WARFARIN - PHARMACIST DOSING INPATIENT
Freq: Every day | Status: DC
  Administered 2013-05-15: 18:00:00

## 2013-05-15 MED ORDER — COUMADIN BOOK
Freq: Once | Status: AC
  Administered 2013-05-15: 16:00:00
  Filled 2013-05-15: qty 1

## 2013-05-15 MED ORDER — WARFARIN SODIUM 10 MG PO TABS
10.0000 mg | ORAL_TABLET | Freq: Once | ORAL | Status: AC
  Administered 2013-05-15: 10 mg via ORAL
  Filled 2013-05-15: qty 1

## 2013-05-15 MED ORDER — WARFARIN VIDEO
Freq: Once | Status: AC
  Administered 2013-05-15: 16:00:00

## 2013-05-15 MED ORDER — ENOXAPARIN SODIUM 150 MG/ML ~~LOC~~ SOLN
140.0000 mg | Freq: Two times a day (BID) | SUBCUTANEOUS | Status: DC
  Administered 2013-05-15 – 2013-05-16 (×2): 140 mg via SUBCUTANEOUS
  Filled 2013-05-15 (×4): qty 1

## 2013-05-15 NOTE — Progress Notes (Signed)
[facility] PROGRESS NOTE  Vincent Mckinney ZOX:[id number] DOB: 02/06/1986 DOA: 05/13/2013 PCP: No primary provider on file.  Assessment/Plan: 1. Portal vein thrombosis - Consult with GI which at that this point not planning any interventions. GI recommended advancement of diet - Consult with hematology who recommended starting oral anticoagulation today. Patient opted to start Coumadin therapy, will place order for Coumadin with Lovenox bridging and discontinue heparin. - Various anticoagulation options discussed with patient and currently he has elected for Coumadin - Hypercoagulable workup pending  2. abdominal pain - Secondary to #1 - Improved at this point continue supportive therapy  Code Status: Full Family Communication: Discussed with patient and significant other at bedside Disposition Plan: We'll plan to transition to oral anticoagulation regimen today and discharged most likely tomorrow 05/16/2013 with Lovenox shots as bridging to Coumadin   Consultants:  Hematology  GI  Procedures:  None  Antibiotics:  None  HPI/Subjective: Patient had many questions regarding anticoagulation options. Has opted for Coumadin. Understands this will require monitoring and that his diet may affect how well the Coumadin thins his blood. He did not feel comfortable going home today as he was not aware that he needed Lovenox injections along with the Coumadin  Objective: Filed Vitals:   05/15/13 1324  BP: 119/83  Pulse: 91  Temp: 98.4 F (36.9 C)  Resp: 18    Intake/Output Summary (Last 24 hours) at 05/15/13 1507 Last data filed at 05/15/13 0600  Gross per 24 hour  Intake   2330 ml  Output      0 ml  Net   2330 ml   Filed Weights   05/13/13 2232 05/14/13 0653  Weight: 142.656 kg (314 lb 8 oz) 138.5 kg (305 lb 5.4 oz)    Exam:   General:  Pt in NAD, alert and awake  Cardiovascular: RRR, no MRG  Respiratory: CTA BL, no wheezes  Abdomen: soft,  ND,  Musculoskeletal: no cyanosis or clubbing   Data Reviewed: Basic Metabolic Panel:  Recent Labs Lab 05/13/13 2232  NA 135  K 4.4  CL 98  CO2 28  GLUCOSE 99  BUN 8  CREATININE 1.04  CALCIUM 9.5   Liver Function Tests:  Recent Labs Lab 05/13/13 2232  AST 33  ALT 22  ALKPHOS 90  BILITOT 0.7  PROT 8.3  ALBUMIN 3.9    Recent Labs Lab 05/13/13 2232  LIPASE 49   No results found for this basename: AMMONIA,  in the last 168 hours CBC:  Recent Labs Lab 05/13/13 2232 05/15/13 0650  WBC 8.8 7.4  NEUTROABS 4.8  --   HGB 16.3 14.9  HCT 46.0 43.6  MCV 89.0 89.7  PLT 224 216   Cardiac Enzymes: No results found for this basename: CKTOTAL, CKMB, CKMBINDEX, TROPONINI,  in the last 168 hours BNP (last 3 results) No results found for this basename: PROBNP,  in the last 8760 hours CBG: No results found for this basename: GLUCAP,  in the last 168 hours  No results found for this or any previous visit (from the past 240 hour(s)).   Studies: Ct Abdomen Pelvis W Contrast  05/14/2013   CLINICAL DATA:  Abdominal pain for 3 days.  EXAM: CT ABDOMEN AND PELVIS WITH CONTRAST  TECHNIQUE: Multidetector CT imaging of the abdomen and pelvis was performed using the standard protocol following bolus administration of intravenous contrast.  CONTRAST:  OMNIPAQUE IOHEXOL 300 MG/ML  SOLN  COMPARISON:  None.  FINDINGS: Minimal bibasilar atelectasis is noted.  There appears to be occlusion of the left portal vein, with associated inflammation and edema, and persistent increased enhancement of the left hepatic lobe. The right portal vein and remainder of the portal venous system appears intact. The liver is otherwise grossly unremarkable. The spleen is within normal limits.  The gallbladder is within normal limits. The pancreas and adrenal glands are unremarkable.  The kidneys are unremarkable in appearance. There is no evidence of hydronephrosis. No renal or ureteral stones are seen. No  perinephric stranding is appreciated.  No free fluid is identified. The small bowel is unremarkable in appearance. The stomach is within normal limits. No acute vascular abnormalities are seen. Periaortic nodes are borderline normal in size. Two left-sided renal arteries are noted.  The appendix is normal in caliber, without evidence for appendicitis. The colon is unremarkable in appearance.  The bladder is mildly distended and grossly unremarkable. The prostate remains normal in size. No inguinal lymphadenopathy is seen.  No acute osseous abnormalities are identified.  IMPRESSION: 1. Occlusion of the left portal vein, with associated inflammation and edema, and persistent increased enhancement of the left hepatic lobe. 2. Otherwise unremarkable CT of the abdomen and pelvis.  These results were called by telephone at the time of interpretation on 05/14/2013 at 2:29 AM to Dr. Ranae Palms, who verbally acknowledged these results.   Electronically Signed   By: Roanna Raider M.D.   On: 05/14/2013 02:34    Scheduled Meds:  Continuous Infusions: . dextrose 5 % and 0.9% NaCl 75 mL/hr at 05/15/13 0920    Active Problems:   Thrombosis, hepatic vein   Abdominal pain   Hepatic vein thrombosis    Time spent: > 35 minutes    Penny Pia  [facility] Pager [phone] If 7PM-7AM, please contact night-coverage at [email or web address], password [facility] 05/15/2013, 3:07 PM  LOS: 2 days

## 2013-05-15 NOTE — Progress Notes (Signed)
ANTICOAGULATION CONSULT NOTE - Follow UP Consult  Pharmacy Consult:  Heparin, change to lovenox and coumadin Indication: Portal Vein Thrombosis  No Known Allergies  Patient Measurements: Height: 6' (182.9 cm) Weight: 305 lb 5.4 oz (138.5 kg) IBW/kg (Calculated) : 77.6 Heparin Dosing Weight: 109 kg  Vital Signs: Temp: 98.4 F (36.9 C) (12/25 1324) Temp src: Oral (12/25 1324) BP: 119/83 mmHg (12/25 1324) Pulse Rate: 91 (12/25 1324)  Labs:  Recent Labs  05/13/13 2232 05/14/13 0521 05/14/13 1839 05/15/13 0650  HGB 16.3  --   --  14.9  HCT 46.0  --   --  43.6  PLT 224  --   --  216  LABPROT  --  13.3  --   --   INR  --  1.03  --   --   HEPARINUNFRC  --   --  0.13* 0.85*  CREATININE 1.04  --   --   --    Estimated Creatinine Clearance: 155.3 ml/min (by C-G formula based on Cr of 1.04).    Assessment: 27 y/o M on IV heparin for possible portal vein thrombosis after CT abdomen shows left portal vein occlusion with inflammation and edema.    Goal of Therapy:  INR 2-3 Monitor platelets by anticoagulation protocol: Yes    Plan:  - Lovenox 140 mg sq q12 hours -Coumadin 10 mg po today - Daily INR    Talbert Cage, PharmD 05/15/2013, 3:12 PM

## 2013-05-15 NOTE — Progress Notes (Signed)
ANTICOAGULATION CONSULT NOTE - Follow UP Consult  Pharmacy Consult:  Heparin  Indication: Portal Vein Thrombosis  No Known Allergies  Patient Measurements: Height: 6' (182.9 cm) Weight: 305 lb 5.4 oz (138.5 kg) IBW/kg (Calculated) : 77.6 Heparin Dosing Weight: 109 kg  Vital Signs: Temp: 98 F (36.7 C) (12/25 0600) Temp src: Oral (12/25 0600) BP: 118/83 mmHg (12/25 0600) Pulse Rate: 83 (12/25 0600)  Labs:  Recent Labs  05/13/13 2232 05/14/13 0521 05/14/13 1839 05/15/13 0650  HGB 16.3  --   --  14.9  HCT 46.0  --   --  43.6  PLT 224  --   --  216  LABPROT  --  13.3  --   --   INR  --  1.03  --   --   HEPARINUNFRC  --   --  0.13* 0.85*  CREATININE 1.04  --   --   --    Estimated Creatinine Clearance: 155.3 ml/min (by C-G formula based on Cr of 1.04).    Assessment: 27 y/o M on IV heparin for possible portal vein thrombosis after CT abdomen shows left portal vein occlusion with inflammation and edema.  Heparin level supra-therapeutic this AM.  Lab drawn appropriately, no complication with infusion, and no bleeding per RN.   Goal of Therapy:  Heparin level 0.3-0.7 units/ml Monitor platelets by anticoagulation protocol: Yes    Plan:  - Decrease IV heparin to 1800 units/hr - Check 6 hr HL - Daily HL / CBC - F/U anticoagulation plans   Annamarie Yamaguchi D. Laney Potash, PharmD, BCPS Pager:  [phone] 05/15/2013, 8:31 AM

## 2013-05-16 ENCOUNTER — Telehealth: Payer: Self-pay | Admitting: *Deleted

## 2013-05-16 ENCOUNTER — Telehealth: Payer: Self-pay | Admitting: Hematology and Oncology

## 2013-05-16 DIAGNOSIS — I81 Portal vein thrombosis: Secondary | ICD-10-CM

## 2013-05-16 LAB — CBC
HCT: 40.6 % (ref 39.0–52.0)
MCH: 30.7 pg (ref 26.0–34.0)
MCHC: 34.2 g/dL (ref 30.0–36.0)
Platelets: 218 10*3/uL (ref 150–400)
RDW: 13.2 % (ref 11.5–15.5)
WBC: 5.8 10*3/uL (ref 4.0–10.5)

## 2013-05-16 LAB — PROTIME-INR: INR: 1.06 (ref 0.00–1.49)

## 2013-05-16 MED ORDER — WARFARIN SODIUM 5 MG PO TABS: 5.0000 mg | ORAL_TABLET | Freq: Every day | ORAL | Status: DC

## 2013-05-16 MED ORDER — ENOXAPARIN SODIUM 150 MG/ML ~~LOC~~ SOLN: 140.0000 mg | Freq: Two times a day (BID) | SUBCUTANEOUS | Status: DC

## 2013-05-16 MED ORDER — TRAMADOL HCL 50 MG PO TABS: 50.0000 mg | ORAL_TABLET | Freq: Four times a day (QID) | ORAL | Status: DC | PRN

## 2013-05-16 MED ORDER — ONDANSETRON HCL 4 MG PO TABS: 4.0000 mg | ORAL_TABLET | Freq: Four times a day (QID) | ORAL | Status: DC | PRN

## 2013-05-16 MED ORDER — WARFARIN SODIUM 10 MG PO TABS
10.0000 mg | ORAL_TABLET | Freq: Once | ORAL | Status: AC
  Administered 2013-05-16: 10 mg via ORAL
  Filled 2013-05-16 (×2): qty 1

## 2013-05-16 NOTE — Telephone Encounter (Signed)
sw pt gv appt for 06/17/13 @ 2:45p. Pt is aware...td

## 2013-05-16 NOTE — Progress Notes (Signed)
Discharge patient. Home discharge instruction given, no question verbalized. Lovenox teaching given .

## 2013-05-16 NOTE — ED Provider Notes (Signed)
Medical screening examination/treatment/procedure(s) were performed by non-physician practitioner and as supervising physician I was immediately available for consultation/collaboration.   Toddrick Sanna, MD 05/16/13 0314 

## 2013-05-16 NOTE — Progress Notes (Signed)
ANTICOAGULATION CONSULT NOTE - Follow UP Consult  Pharmacy Consult:  Lovenox / Coumadin Indication: Portal Vein Thrombosis  No Known Allergies  Patient Measurements: Height: 6' (182.9 cm) Weight: 305 lb 5.4 oz (138.5 kg) IBW/kg (Calculated) : 77.6  Vital Signs: Temp: 98.2 F (36.8 C) (12/26 0423) Temp src: Oral (12/25 2214) BP: 102/67 mmHg (12/26 0423) Pulse Rate: 76 (12/26 0423)  Labs:  Recent Labs  05/13/13 2232 05/14/13 0521 05/14/13 1839 05/15/13 0650 05/16/13 0553  HGB 16.3  --   --  14.9 13.9  HCT 46.0  --   --  43.6 40.6  PLT 224  --   --  216 218  LABPROT  --  13.3  --   --  13.6  INR  --  1.03  --   --  1.06  HEPARINUNFRC  --   --  0.13* 0.85*  --   CREATININE 1.04  --   --   --   --    Estimated Creatinine Clearance: 155.3 ml/min (by C-G formula based on Cr of 1.04).    Assessment: 27 y/o M on IV heparin for possible portal vein thrombosis after CT abdomen shows left portal vein occlusion with inflammation and edema.  Heparin level supra-therapeutic this AM.  Lab drawn appropriately, no complication with infusion, and no bleeding per RN.   Goal of Therapy:  INR 2-3 Anti-Xa level 0.6-1 units/ml 4hrs after LMWH dose given Monitor platelets by anticoagulation protocol: Yes    Plan:  - Coumadin 10mg  PO today, give prior to discharge - Continue Lovenox 140mg  SQ Q12H for at least 4 more days - INR in AM if still here    Amara Manalang D. Laney Potash, PharmD, BCPS Pager:  [phone] 05/16/2013, 9:26 AM

## 2013-05-16 NOTE — Telephone Encounter (Signed)
Gave pt appt calendar  for lab to nurse

## 2013-05-16 NOTE — ED Provider Notes (Signed)
Medical screening examination/treatment/procedure(s) were performed by non-physician practitioner and as supervising physician I was immediately available for consultation/collaboration.   Cortney Mckinney, MD 05/16/13 0312 

## 2013-05-16 NOTE — Telephone Encounter (Signed)
Call from pt.  He was d/c'd from hospital yesterday on Coumadin 5 mg daily and rx for lovenox 140 mg twice daily.  He went to [facility] today to get lovenox filled and his co pay is $100.  He states cannot afford to pay for the lovenox.  Obtained 6 prefilled syringes lovenox 150mg /ml from our pharmacy for pt to inject 0.93 ml (140 mg) BID at home.  This will last him through Monday morning. Scheduled Lab appt for Monday 12/29 at 9:45 am.  Pt came by clinic and I gave him the lovenox syringes and appt for lab on Monday.  He was very Adult nurse.

## 2013-05-16 NOTE — Discharge Summary (Signed)
Physician Discharge Summary  Garret Teale ZOX:[id number] DOB: 04-23-1986 DOA: 05/13/2013  PCP: No primary provider on file.  Admit date: 05/13/2013 Discharge date: 05/16/2013  Time spent: > 35 minutes  Recommendations for Outpatient Follow-up:  1. F/u with hypercoagulable work up  Discharge Diagnoses:  Active Problems:   Thrombosis, hepatic vein   Abdominal pain   Portal vein thrombosis   Discharge Condition: stable  Diet recommendation: general  Filed Weights   05/13/13 2232 05/14/13 0653  Weight: 142.656 kg (314 lb 8 oz) 138.5 kg (305 lb 5.4 oz)    History of present illness:  27 y/o AAM generally healthy who presented with sudden onset abdominal discomfort. Found to have an occluded left portal vein on CT scan  Hospital Course:  1. Portal vein thrombosis - Consult with GI which at that this point not planning any interventions. GI recommended advancement of diet  - Consult with hematology who recommended starting oral anticoagulation. Patient opted to start Coumadin therapy, will place order for Coumadin with Lovenox bridging and have patient follow up on Monday 05/19/13 with oncologist. - Hypercoagulable workup pending, oncologist to follow up  2. abdominal pain  - Secondary to #1  - Improved at this point continue supportive therapy on discharge   Procedures:  None  Consultations:  GI  Oncology: Dr. Bertis Ruddy  Discharge Exam: Filed Vitals:   05/16/13 0423  BP: 102/67  Pulse: 76  Temp: 98.2 F (36.8 C)  Resp: 16    General: Pt in NAD, Alert and Awake Cardiovascular: RRR, no MRG Respiratory: CTA BL, no wheezes   Discharge Instructions  Discharge Orders   Future Appointments Provider Department Dept Phone   06/17/2013 2:45 PM Artis Delay, MD [facility] [phone]   Future Orders Complete By Expires   Call MD for:  difficulty breathing, headache or visual disturbances  As directed    Call MD for:  persistant  nausea and vomiting  As directed    Call MD for:  severe uncontrolled pain  As directed    Diet - low sodium heart healthy  As directed    Discharge instructions  As directed    Comments:     F/u with Dr. Bertis Ruddy (oncology) this Monday 05/19/13   Increase activity slowly  As directed        Medication List         enoxaparin 150 MG/ML injection  Commonly known as:  LOVENOX  Inject 0.93 mLs (140 mg total) into the skin every 12 (twelve) hours.     omeprazole 20 MG capsule  Commonly known as:  PRILOSEC  Take 1 capsule (20 mg total) by mouth daily.     ondansetron 4 MG tablet  Commonly known as:  ZOFRAN  Take 1 tablet (4 mg total) by mouth every 6 (six) hours as needed for nausea.     traMADol 50 MG tablet  Commonly known as:  ULTRAM  Take 1 tablet (50 mg total) by mouth every 6 (six) hours as needed.     warfarin 5 MG tablet  Commonly known as:  COUMADIN  Take 1 tablet (5 mg total) by mouth daily.       No Known Allergies     Follow-up Information   Schedule an appointment as soon as possible for a visit with your PCP. (for further evaluation)        The results of significant diagnostics from this hospitalization (including imaging, microbiology, ancillary and laboratory) are listed below  for reference.    Significant Diagnostic Studies: Ct Abdomen Pelvis W Contrast  05/14/2013   CLINICAL DATA:  Abdominal pain for 3 days.  EXAM: CT ABDOMEN AND PELVIS WITH CONTRAST  TECHNIQUE: Multidetector CT imaging of the abdomen and pelvis was performed using the standard protocol following bolus administration of intravenous contrast.  CONTRAST:  OMNIPAQUE IOHEXOL 300 MG/ML  SOLN  COMPARISON:  None.  FINDINGS: Minimal bibasilar atelectasis is noted.  There appears to be occlusion of the left portal vein, with associated inflammation and edema, and persistent increased enhancement of the left hepatic lobe. The right portal vein and remainder of the portal venous system appears  intact. The liver is otherwise grossly unremarkable. The spleen is within normal limits.  The gallbladder is within normal limits. The pancreas and adrenal glands are unremarkable.  The kidneys are unremarkable in appearance. There is no evidence of hydronephrosis. No renal or ureteral stones are seen. No perinephric stranding is appreciated.  No free fluid is identified. The small bowel is unremarkable in appearance. The stomach is within normal limits. No acute vascular abnormalities are seen. Periaortic nodes are borderline normal in size. Two left-sided renal arteries are noted.  The appendix is normal in caliber, without evidence for appendicitis. The colon is unremarkable in appearance.  The bladder is mildly distended and grossly unremarkable. The prostate remains normal in size. No inguinal lymphadenopathy is seen.  No acute osseous abnormalities are identified.  IMPRESSION: 1. Occlusion of the left portal vein, with associated inflammation and edema, and persistent increased enhancement of the left hepatic lobe. 2. Otherwise unremarkable CT of the abdomen and pelvis.  These results were called by telephone at the time of interpretation on 05/14/2013 at 2:29 AM to Dr. Ranae Palms, who verbally acknowledged these results.   Electronically Signed   By: Roanna Raider M.D.   On: 05/14/2013 02:34    Microbiology: No results found for this or any previous visit (from the past 240 hour(s)).   Labs: Basic Metabolic Panel:  Recent Labs Lab 05/13/13 2232  NA 135  K 4.4  CL 98  CO2 28  GLUCOSE 99  BUN 8  CREATININE 1.04  CALCIUM 9.5   Liver Function Tests:  Recent Labs Lab 05/13/13 2232  AST 33  ALT 22  ALKPHOS 90  BILITOT 0.7  PROT 8.3  ALBUMIN 3.9    Recent Labs Lab 05/13/13 2232  LIPASE 49   No results found for this basename: AMMONIA,  in the last 168 hours CBC:  Recent Labs Lab 05/13/13 2232 05/15/13 0650 05/16/13 0553  WBC 8.8 7.4 5.8  NEUTROABS 4.8  --   --   HGB  16.3 14.9 13.9  HCT 46.0 43.6 40.6  MCV 89.0 89.7 89.6  PLT 224 216 218   Cardiac Enzymes: No results found for this basename: CKTOTAL, CKMB, CKMBINDEX, TROPONINI,  in the last 168 hours BNP: BNP (last 3 results) No results found for this basename: PROBNP,  in the last 8760 hours CBG: No results found for this basename: GLUCAP,  in the last 168 hours     Signed:  Penny Pia  [facility] 05/16/2013, 9:02 AM

## 2013-05-18 ENCOUNTER — Other Ambulatory Visit: Payer: Self-pay | Admitting: Hematology and Oncology

## 2013-05-18 DIAGNOSIS — I820 Budd-Chiari syndrome: Secondary | ICD-10-CM

## 2013-05-18 NOTE — Telephone Encounter (Signed)
I just put order for coumadin clinic PLease make sure it gets scheduled Thanks

## 2013-05-19 ENCOUNTER — Telehealth: Payer: Self-pay | Admitting: *Deleted

## 2013-05-19 ENCOUNTER — Ambulatory Visit (HOSPITAL_BASED_OUTPATIENT_CLINIC_OR_DEPARTMENT_OTHER): Payer: BC Managed Care – PPO

## 2013-05-19 ENCOUNTER — Other Ambulatory Visit: Payer: Self-pay | Admitting: Hematology and Oncology

## 2013-05-19 ENCOUNTER — Telehealth: Payer: Self-pay | Admitting: Pharmacist

## 2013-05-19 DIAGNOSIS — I81 Portal vein thrombosis: Secondary | ICD-10-CM

## 2013-05-19 DIAGNOSIS — I820 Budd-Chiari syndrome: Secondary | ICD-10-CM

## 2013-05-19 LAB — BETA-2-GLYCOPROTEIN I ABS, IGG/M/A
Beta-2 Glyco I IgG: 4 G Units (ref <20)
Beta-2-Glycoprotein I IgA: 4 A Units (ref <20)
Beta-2-Glycoprotein I IgM: 4 M Units (ref <20)

## 2013-05-19 LAB — PROTEIN C, TOTAL: Protein C, Total: 70 % — ABNORMAL LOW (ref 72–160)

## 2013-05-19 LAB — CARDIOLIPIN ANTIBODIES, IGG, IGM, IGA: Anticardiolipin IgM: 4 MPL U/mL — ABNORMAL LOW (ref <11)

## 2013-05-19 LAB — FACTOR 5 LEIDEN

## 2013-05-19 LAB — MISCELLANEOUS TEST

## 2013-05-19 LAB — PROTEIN S, TOTAL: Protein S Ag, Total: 70 % (ref 60–150)

## 2013-05-19 NOTE — Telephone Encounter (Signed)
Dr. Bertis Ruddy reviewed pt's INR results today.  INR = 1.1.   She instructed for pt to increase Coumadin from 5 mg daily to 7.5 mg daily and to continue Lovenox 140 mg BID.  Return on Friday this week for lab and Coumadin Clinic.  Spoke w/ pt in lobby and Instructed on coumadin dose change and to continue lovenox same dose.  Gave him eight Lovenox 150 mg/ml syringes obtained from our pharmacy.  Pt stated a lot of gratitude and verbalized understanding of Dr. Maxine Glenn orders.  He understands he will be notified of time for lab/coumadin clinic for this Friday.

## 2013-05-19 NOTE — Telephone Encounter (Signed)
New coumadin clinic patient. INR today 1.1. Dr. Bertis Ruddy enrolled patient today in our coumadin clinic. Pt increased coumadin to 7.5 mg daily today per Dr. Maxine Glenn instructions and is continuing Lovenox 140 mg BID. Pt set up for coumadin clinic visit on 05/23/13 at 9:45am for lab and 10am for coumadin clinic. Scheduling aware.  Thank you, Christell Faith, PharmD

## 2013-05-20 LAB — PROTHROMBIN GENE MUTATION

## 2013-05-21 LAB — JAK2 GENOTYPR: JAK2 GenotypR: NOT DETECTED

## 2013-05-23 ENCOUNTER — Other Ambulatory Visit (HOSPITAL_BASED_OUTPATIENT_CLINIC_OR_DEPARTMENT_OTHER): Payer: BC Managed Care – PPO

## 2013-05-23 ENCOUNTER — Ambulatory Visit (HOSPITAL_BASED_OUTPATIENT_CLINIC_OR_DEPARTMENT_OTHER): Payer: BC Managed Care – PPO | Admitting: Pharmacist

## 2013-05-23 DIAGNOSIS — I81 Portal vein thrombosis: Secondary | ICD-10-CM

## 2013-05-23 DIAGNOSIS — I820 Budd-Chiari syndrome: Secondary | ICD-10-CM

## 2013-05-23 LAB — POCT INR: INR: 1.1

## 2013-05-23 LAB — PROTIME-INR
INR: 1.1 — ABNORMAL LOW (ref 2.00–3.50)
PROTIME: 13.2 s (ref 10.6–13.4)

## 2013-05-23 MED ORDER — WARFARIN SODIUM 5 MG PO TABS: 10.0000 mg | ORAL_TABLET | Freq: Every day | ORAL | Status: DC

## 2013-05-23 NOTE — Progress Notes (Signed)
INR below goal today. Pt took coumadin as instructed (7.5mg  daily) and continues on Lovenox 140mg  BID. No changes to report. Very minimal bruising on abdomen from Lovenox injections. Medication list updated with Garlic and Omega 3 supplements. No other changes. No concerns regarding anticoagulation. Pt disappointed that his INR is the same as earlier this week. This is the first visit to the coumadin clinic for the patient. A full discussion of the nature of anticoagulants has been carried out.  The need for frequent and regular monitoring, precise dosage adjustment and compliance is stressed. Side effects of potential bleeding are discussed.  Drug-drug interactions and drug-food interactions have been discussed.  We reviewed the importance of consistency with Vitamin K intake.  Warfarin education sheets have been provided to pt.  I informed him to avoid great swings in general diet.  He does not drink alchohol  Take coumadin 12.5mg  today.  On 05/24/13, begin 10mg  daily. Recheck INR on 05/27/13; lab at 3:30pm and Coumadin clinic at 3:45pm.

## 2013-05-23 NOTE — Addendum Note (Signed)
Addended by: Neita GoodnightPERSSON, Declynn Lopresti L on: 05/23/2013 11:37 AM   Modules accepted: Orders

## 2013-05-23 NOTE — Patient Instructions (Signed)
Take coumadin 12.5mg  today.  On 05/24/13, begin 10mg  daily. Recheck INR on 05/27/13; lab at 3:30pm and Coumadin clinic at 3:45pm.

## 2013-05-27 ENCOUNTER — Other Ambulatory Visit: Payer: Self-pay | Admitting: Pharmacist

## 2013-05-27 ENCOUNTER — Ambulatory Visit (HOSPITAL_BASED_OUTPATIENT_CLINIC_OR_DEPARTMENT_OTHER): Payer: BC Managed Care – PPO | Admitting: Pharmacist

## 2013-05-27 ENCOUNTER — Other Ambulatory Visit (HOSPITAL_BASED_OUTPATIENT_CLINIC_OR_DEPARTMENT_OTHER): Payer: BC Managed Care – PPO

## 2013-05-27 DIAGNOSIS — I82890 Acute embolism and thrombosis of other specified veins: Secondary | ICD-10-CM

## 2013-05-27 DIAGNOSIS — I820 Budd-Chiari syndrome: Secondary | ICD-10-CM

## 2013-05-27 DIAGNOSIS — I81 Portal vein thrombosis: Secondary | ICD-10-CM

## 2013-05-27 LAB — PROTIME-INR
INR: 1.2 — ABNORMAL LOW (ref 2.00–3.50)
Protime: 14.4 Seconds — ABNORMAL HIGH (ref 10.6–13.4)

## 2013-05-27 LAB — POCT INR: INR: 1.2

## 2013-05-27 NOTE — Progress Notes (Signed)
INR remains unchanged after increasing coumadin to 10mg  daily for the past 4 days.  Vincent Mckinney c/o of some bruising on abdomen from Lovenox injections.  Will continue Lovenox 140mg  daily and increase coumadin to 15mg  daily.  Will check PT/INR in 4 days to see in INR starting to increase.  Will also check a CBC at that time to evaluate plts on Lovenox.

## 2013-05-30 ENCOUNTER — Telehealth: Payer: Self-pay | Admitting: *Deleted

## 2013-05-30 ENCOUNTER — Other Ambulatory Visit (HOSPITAL_BASED_OUTPATIENT_CLINIC_OR_DEPARTMENT_OTHER): Payer: BC Managed Care – PPO

## 2013-05-30 ENCOUNTER — Telehealth: Payer: Self-pay | Admitting: Hematology and Oncology

## 2013-05-30 ENCOUNTER — Ambulatory Visit (HOSPITAL_BASED_OUTPATIENT_CLINIC_OR_DEPARTMENT_OTHER): Payer: BC Managed Care – PPO | Admitting: Pharmacist

## 2013-05-30 DIAGNOSIS — I81 Portal vein thrombosis: Secondary | ICD-10-CM

## 2013-05-30 DIAGNOSIS — I82890 Acute embolism and thrombosis of other specified veins: Secondary | ICD-10-CM

## 2013-05-30 DIAGNOSIS — I820 Budd-Chiari syndrome: Secondary | ICD-10-CM

## 2013-05-30 LAB — CBC WITH DIFFERENTIAL/PLATELET
BASO%: 0.2 % (ref 0.0–2.0)
Basophils Absolute: 0 10*3/uL (ref 0.0–0.1)
EOS%: 4.4 % (ref 0.0–7.0)
Eosinophils Absolute: 0.3 10*3/uL (ref 0.0–0.5)
HCT: 43.5 % (ref 38.4–49.9)
HGB: 14.7 g/dL (ref 13.0–17.1)
LYMPH#: 2.3 10*3/uL (ref 0.9–3.3)
LYMPH%: 38.7 % (ref 14.0–49.0)
MCH: 29.9 pg (ref 27.2–33.4)
MCHC: 33.8 g/dL (ref 32.0–36.0)
MCV: 88.4 fL (ref 79.3–98.0)
MONO#: 0.5 10*3/uL (ref 0.1–0.9)
MONO%: 8.8 % (ref 0.0–14.0)
NEUT#: 2.9 10*3/uL (ref 1.5–6.5)
NEUT%: 47.9 % (ref 39.0–75.0)
Platelets: 235 10*3/uL (ref 140–400)
RBC: 4.92 10*6/uL (ref 4.20–5.82)
RDW: 13.6 % (ref 11.0–14.6)
WBC: 5.9 10*3/uL (ref 4.0–10.3)

## 2013-05-30 LAB — POCT INR: INR: 1.1

## 2013-05-30 LAB — PROTIME-INR
INR: 1.1 — AB (ref 2.00–3.50)
PROTIME: 13.2 s (ref 10.6–13.4)

## 2013-05-30 NOTE — Progress Notes (Signed)
INR = 1.1 on Coumadin 15 mg/day He remains on Lovenox 140 mg SQ Q12 hrs until INR at goal = 2-3 He missed his morning dose of Lovenox yesterday.  He was at work & dropped the syringe on the bathroom floor.  It broke so he was not able to take his dose.  He did take the evening dose. He reports not missing any of his Coumadin doses. Pt ate pizza last night w/ spinach on it. Pt reports RLQ bruising from the Lovenox injections. INR subtherapeutic.  Increase Coumadin to 20 mg daily. Continue on Lovenox 140 mg SQ Q12 hrs.  I gave him 8 more syringes of 150 mg strength; he is discarding a small amt before injecting. Repeat protime on Monday.  He is already off work that day for 1/2 day. Ebony HailGinna Kaitlin Ardito, Pharm.D., CPP 05/30/2013@11 :01 AM

## 2013-05-30 NOTE — Telephone Encounter (Signed)
added NG for 1/12 @ 10:30am. d/t/pt aware per pof. also per pof cx 1/12 lb/CC. LB/CC not on schedule.

## 2013-05-30 NOTE — Telephone Encounter (Signed)
Pt confirmed appt w/ Dr. Bertis RuddyGorsuch on Monday 1/12 at 10:30 am.

## 2013-05-30 NOTE — Telephone Encounter (Signed)
Dr. Bertis RuddyGorsuch would like to see pt to discuss possibly switching to Xarelto from Coumadin.   She can see pt on Monday 1/12 at 10:30 am and cancel lab/coumadin clnic Monday morning.   Left Vm for pt informing of appt and asked him to return call to confirm.

## 2013-06-02 ENCOUNTER — Ambulatory Visit (HOSPITAL_BASED_OUTPATIENT_CLINIC_OR_DEPARTMENT_OTHER): Payer: BC Managed Care – PPO | Admitting: Hematology and Oncology

## 2013-06-02 ENCOUNTER — Encounter: Payer: Self-pay | Admitting: Hematology and Oncology

## 2013-06-02 ENCOUNTER — Telehealth: Payer: Self-pay | Admitting: Hematology and Oncology

## 2013-06-02 VITALS — BP 134/93 | HR 83 | Temp 97.8°F | Wt 324.8 lb

## 2013-06-02 DIAGNOSIS — I820 Budd-Chiari syndrome: Secondary | ICD-10-CM

## 2013-06-02 MED ORDER — RIVAROXABAN 20 MG PO TABS: 20.0000 mg | ORAL_TABLET | Freq: Every day | ORAL | Status: DC

## 2013-06-02 NOTE — Progress Notes (Signed)
[facility] OFFICE PROGRESS NOTE  No primary provider on file. DIAGNOSIS:  Hepatic vein thrombosis  SUMMARY OF HEMATOLOGIC HISTORY: This is a pleasant 28 year old gentleman with him provoked DVT. He was placed on anticoagulation therapy with warfarin INTERVAL HISTORY: Vincent Mckinney 28 y.o. male returns for further followup. He has significant bruising in his abdomen from the Lovenox injection. Despite on 20 mg of warfarin, his INR remain at 1.1. He denies any further pain in his abdomen.  I have reviewed the past medical history, past surgical history, social history and family history with the patient and they are unchanged from previous note.  ALLERGIES:  has No Known Allergies.  MEDICATIONS:  Current Outpatient Prescriptions  Medication Sig Dispense Refill  . enoxaparin (LOVENOX) 150 MG/ML injection Inject 0.93 mLs (140 mg total) into the skin every 12 (twelve) hours.  1400 mL  0  . GNP GARLIC EXTRACT PO Take 1 capsule by mouth daily.      . Omega-3 Fatty Acids (OMEGA 3 PO) Take 1,600 mg by mouth daily. Takes 1600mg  daily. 1Tablespoon = 1600mg .      . warfarin (COUMADIN) 5 MG tablet Take 20 mg by mouth daily.      . Rivaroxaban (XARELTO) 20 MG TABS tablet Take 1 tablet (20 mg total) by mouth daily with supper.  30 tablet  5   No current facility-administered medications for this visit.     REVIEW OF SYSTEMS:   Constitutional: Denies fevers, chills or night sweats All other systems were reviewed with the patient and are negative.  PHYSICAL EXAMINATION: ECOG PERFORMANCE STATUS: 0 - Asymptomatic  Filed Vitals:   06/02/13 1043  BP: 134/93  Pulse: 83  Temp: 97.8 F (36.6 C)   Filed Weights   06/02/13 1043  Weight: 324 lb 12.8 oz (147.328 kg)    GENERAL:alert, no distress and comfortable NEURO: alert & oriented x 3 with fluent speech, no focal motor/sensory deficits  LABORATORY DATA:  I have reviewed the data as listed No results found for this or  any previous visit (from the past 48 hour(s)).  Lab Results  Component Value Date   WBC 5.9 05/30/2013   HGB 14.7 05/30/2013   HCT 43.5 05/30/2013   MCV 88.4 05/30/2013   PLT 235 05/30/2013   ASSESSMENT & PLAN:  #1 DVT The patient stated he's been compliant with treatment recommendation and has been avoiding excessive ingestion of green, leafy vegetables. His INR remained subtherapeutic despite high doses of warfarin. I recommend we'll switch his treatment to either Lovenox or Xarelto Risks, benefits, side effects of each treatment option was discussed with the patient and he agreed to be switched to Xarelto Patient education information was dispensed. We'll start him on 20 mg once a day. I'll see him back in a month for blood work and further evaluation. All questions were answered. patient knows to call the clinic with any problems, questions or concerns. No barriers to learning was detected.  I spent 15 minutes counseling the patient face to face. The total time spent in the appointment was 20 minutes and more than 50% was on counseling.     Khori Underberg, MD 06/02/2013 11:25 AM

## 2013-06-02 NOTE — Patient Instructions (Signed)
Rivaroxaban oral tablets °What is this medicine? °RIVAROXABAN (ri va ROX a ban) is an anticoagulant (blood thinner). It is used to treat blood clots in the lungs or in the veins. It is also used after knee or hip surgeries to prevent blood clots. It is also used to lower the chance of stroke in people with a medical condition called atrial fibrillation. °This medicine may be used for other purposes; ask your health care provider or pharmacist if you have questions. °COMMON BRAND NAME(S): Xarelto °What should I tell my health care provider before I take this medicine? °They need to know if you have any of these conditions: °-bleeding disorders °-bleeding in the brain °-blood in your stools (black or tarry stools) or if you have blood in your vomit °-history of stomach bleeding °-kidney disease °-liver disease °-low blood counts, like low white cell, platelet, or red cell counts °-recent or planned spinal or epidural procedure °-take medicines that treat or prevent blood clots °-an unusual or allergic reaction to rivaroxaban, other medicines, foods, dyes, or preservatives °-pregnant or trying to get pregnant °-breast-feeding °How should I use this medicine? °Take this medicine by mouth with a glass of water. Follow the directions on the prescription label. Take your medicine at regular intervals. Do not take it more often than directed. Do not stop taking except on your doctor's advice. Stopping this medicine may increase your risk of a blot clot. Be sure to refill your prescription before you run out of medicine. °If you are taking this medicine after hip or knee replacement surgery, take it with or without food. If you are taking this medicine for atrial fibrillation, take it with your evening meal. If you are taking this medicine to treat blood clots, take it with food at the same time each day. If you are unable to swallow your tablet, you may crush the tablet and mix it in applesauce. Then, immediately eat the  applesauce. You should eat more food right after you eat the applesauce containing the crushed tablet. °Talk to your pediatrician regarding the use of this medicine in children. Special care may be needed. °Overdosage: If you think you have taken too much of this medicine contact a poison control center or emergency room at once. °NOTE: This medicine is only for you. Do not share this medicine with others. °What if I miss a dose? °If you take your medicine once a day and miss a dose, take the missed dose as soon as you remember. If you take your medicine twice a day and miss a dose, take the missed dose immediately. In this instance, 2 tablets may be taken at the same time. The next day you should take 1 tablet twice a day as directed. °What may interact with this medicine? °-aspirin and aspirin-like medicines °-certain antibiotics like erythromycin, azithromycin, and clarithromycin °-certain medicines for fungal infections like ketoconazole and itraconazole °-certain medicines for irregular heart beat like amiodarone, quinidine, dronedarone °-certain medicines for seizures like carbamazepine, phenytoin °-certain medicines that treat or prevent blood clots like warfarin, enoxaparin, and dalteparin  °-conivaptan °-diltiazem °-felodipine °-indinavir °-lopinavir; ritonavir °-NSAIDS, medicines for pain and inflammation, like ibuprofen or naproxen °-ranolazine °-rifampin °-ritonavir °-St. John's wort °-verapamil °This list may not describe all possible interactions. Give your health care provider a list of all the medicines, herbs, non-prescription drugs, or dietary supplements you use. Also tell them if you smoke, drink alcohol, or use illegal drugs. Some items may interact with your medicine. °What should I   watch for while using this medicine? °Visit your doctor or health care professional for regular checks on your progress. Your condition will be monitored carefully while you are receiving this medicine. °Notify your  doctor or health care professional and seek emergency treatment if you develop breathing problems; changes in vision; chest pain; severe, sudden headache; pain, swelling, warmth in the leg; trouble speaking; sudden numbness or weakness of the face, arm, or leg. These can be signs that your condition has gotten worse. °If you are going to have surgery, tell your doctor or health care professional that you are taking this medicine. °Tell your health care professional that you use this medicine before you have a spinal or epidural procedure. Sometimes people who take this medicine have bleeding problems around the spine when they have a spinal or epidural procedure. This bleeding is very rare. If you have a spinal or epidural procedure while on this medicine, call your health care professional immediately if you have back pain, numbness or tingling (especially in your legs and feet), muscle weakness, paralysis, or loss of bladder or bowel control. °Avoid sports and activities that might cause injury while you are using this medicine. Severe falls or injuries can cause unseen bleeding. Be careful when using sharp tools or knives. Consider using an electric razor. Take special care brushing or flossing your teeth. Report any injuries, bruising, or red spots on the skin to your doctor or health care professional. °What side effects may I notice from receiving this medicine? °Side effects that you should report to your doctor or health care professional as soon as possible: °-allergic reactions like skin rash, itching or hives, swelling of the face, lips, or tongue °-back pain °-redness, blistering, peeling or loosening of the skin, including inside the mouth °-signs and symptoms of bleeding such as bloody or black, tarry stools; red or dark-brown urine; spitting up blood or brown material that looks like coffee grounds; red spots on the skin; unusual bruising or bleeding from the eye, gums, or nose  °Side effects that  usually do not require medical attention (Report these to your doctor or health care professional if they continue or are bothersome.): °-dizziness °-muscle pain °This list may not describe all possible side effects. Call your doctor for medical advice about side effects. You may report side effects to FDA at 1-800-FDA-1088. °Where should I keep my medicine? °Keep out of the reach of children. °Store at room temperature between 15 and 30 degrees C (59 and 86 degrees F). Throw away any unused medicine after the expiration date. °NOTE: This sheet is a summary. It may not cover all possible information. If you have questions about this medicine, talk to your doctor, pharmacist, or health care provider. °© 2014, [employer]. (2012-10-23 09:51:31) ° °

## 2013-06-02 NOTE — Telephone Encounter (Signed)
gv and printed appt sched and avs for pt for Feb  °

## 2013-06-17 ENCOUNTER — Ambulatory Visit: Payer: BC Managed Care – PPO | Admitting: Hematology and Oncology

## 2013-06-30 ENCOUNTER — Telehealth: Payer: Self-pay | Admitting: Hematology and Oncology

## 2013-06-30 ENCOUNTER — Encounter (INDEPENDENT_AMBULATORY_CARE_PROVIDER_SITE_OTHER): Payer: Self-pay

## 2013-06-30 ENCOUNTER — Other Ambulatory Visit (HOSPITAL_BASED_OUTPATIENT_CLINIC_OR_DEPARTMENT_OTHER): Payer: BC Managed Care – PPO

## 2013-06-30 ENCOUNTER — Encounter: Payer: Self-pay | Admitting: Hematology and Oncology

## 2013-06-30 ENCOUNTER — Ambulatory Visit (HOSPITAL_BASED_OUTPATIENT_CLINIC_OR_DEPARTMENT_OTHER): Payer: BC Managed Care – PPO | Admitting: Hematology and Oncology

## 2013-06-30 VITALS — BP 129/92 | HR 71 | Temp 98.1°F | Resp 20 | Ht 72.0 in | Wt 326.5 lb

## 2013-06-30 DIAGNOSIS — I820 Budd-Chiari syndrome: Secondary | ICD-10-CM

## 2013-06-30 DIAGNOSIS — K649 Unspecified hemorrhoids: Secondary | ICD-10-CM

## 2013-06-30 LAB — COMPREHENSIVE METABOLIC PANEL (CC13)
ALK PHOS: 86 U/L (ref 40–150)
ALT: 26 U/L (ref 0–55)
AST: 25 U/L (ref 5–34)
Albumin: 4 g/dL (ref 3.5–5.0)
Anion Gap: 9 mEq/L (ref 3–11)
BUN: 10.1 mg/dL (ref 7.0–26.0)
CALCIUM: 10 mg/dL (ref 8.4–10.4)
CO2: 27 mEq/L (ref 22–29)
Chloride: 103 mEq/L (ref 98–109)
Creatinine: 0.9 mg/dL (ref 0.7–1.3)
Glucose: 107 mg/dl (ref 70–140)
Potassium: 4.3 mEq/L (ref 3.5–5.1)
SODIUM: 138 meq/L (ref 136–145)
TOTAL PROTEIN: 7.7 g/dL (ref 6.4–8.3)
Total Bilirubin: 0.49 mg/dL (ref 0.20–1.20)

## 2013-06-30 LAB — CBC WITH DIFFERENTIAL/PLATELET
BASO%: 0.2 % (ref 0.0–2.0)
BASOS ABS: 0 10*3/uL (ref 0.0–0.1)
EOS ABS: 0.2 10*3/uL (ref 0.0–0.5)
EOS%: 4.5 % (ref 0.0–7.0)
HCT: 44.2 % (ref 38.4–49.9)
HEMOGLOBIN: 14.8 g/dL (ref 13.0–17.1)
LYMPH%: 38.6 % (ref 14.0–49.0)
MCH: 29.7 pg (ref 27.2–33.4)
MCHC: 33.5 g/dL (ref 32.0–36.0)
MCV: 88.8 fL (ref 79.3–98.0)
MONO#: 0.2 10*3/uL (ref 0.1–0.9)
MONO%: 4.1 % (ref 0.0–14.0)
NEUT%: 52.6 % (ref 39.0–75.0)
NEUTROS ABS: 2.7 10*3/uL (ref 1.5–6.5)
Platelets: 224 10*3/uL (ref 140–400)
RBC: 4.98 10*6/uL (ref 4.20–5.82)
RDW: 13.8 % (ref 11.0–14.6)
WBC: 5.1 10*3/uL (ref 4.0–10.3)
lymph#: 2 10*3/uL (ref 0.9–3.3)

## 2013-06-30 NOTE — Telephone Encounter (Signed)
s.w. pt and advised on April appt.....pt ok and aware °

## 2013-06-30 NOTE — Progress Notes (Signed)
[facility] OFFICE PROGRESS NOTE  No primary provider on file. 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 He was placed on warfarin therapy but despite high doses, his INR remained low. He was subsequently switched to Xarelto INTERVAL HISTORY: Osborn Coho 28 y.o. male returns for further followup. He is doing well on current anticoagulation therapy. He did notice occasional passage of fresh blood per rectum. He thought that could be related to hemorrhoidal bleeding. It is happening approximately once a week. He denies any other forms of bleeding such as epistaxis, hematuria or increased bruising.  I have reviewed the past medical history, past surgical history, social history and family history with the patient and they are unchanged from previous note.  ALLERGIES:  has No Known Allergies.  MEDICATIONS:  Current Outpatient Prescriptions  Medication Sig Dispense Refill  . GNP GARLIC EXTRACT PO Take 1 capsule by mouth daily.      . Omega-3 Fatty Acids (OMEGA 3 PO) Take 1,600 mg by mouth daily. Takes 1600mg  daily. 1Tablespoon = 1600mg .      . Rivaroxaban (XARELTO) 20 MG TABS tablet Take 1 tablet (20 mg total) by mouth daily with supper.  30 tablet  5   No current facility-administered medications for this visit.     REVIEW OF SYSTEMS:   Constitutional: Denies fevers, chills or night sweats Eyes: Denies blurriness of vision Ears, nose, mouth, throat, and face: Denies mucositis or sore throat Respiratory: Denies cough, dyspnea or wheezes Cardiovascular: Denies palpitation, chest discomfort or lower extremity swelling Gastrointestinal:  Denies nausea, heartburn or change in bowel habits Skin: Denies abnormal skin rashes Lymphatics: Denies new lymphadenopathy or easy bruising Neurological:Denies  numbness, tingling or new weaknesses Behavioral/Psych: Mood is stable, no new changes  All other systems were reviewed with the patient and are negative.  PHYSICAL EXAMINATION: ECOG PERFORMANCE STATUS: 0 - Asymptomatic  Filed Vitals:   06/30/13 0855  BP: 129/92  Pulse: 71  Temp: 98.1 F (36.7 C)  Resp: 20   Filed Weights   06/30/13 0855  Weight: 326 lb 8 oz (148.099 kg)    GENERAL:alert, no distress and comfortable SKIN: skin color, texture, turgor are normal, no rashes or significant lesions Musculoskeletal:no cyanosis of digits and no clubbing  NEURO: alert & oriented x 3 with fluent speech, no focal motor/sensory deficits  LABORATORY DATA:  I have reviewed the data as listed Results for orders placed in visit on 06/30/13 (from the past 48 hour(s))  CBC WITH DIFFERENTIAL     Status: None   Collection Time    06/30/13  8:43 AM      Result Value Range   WBC 5.1  4.0 - 10.3 10e3/uL   NEUT# 2.7  1.5 - 6.5 10e3/uL   HGB 14.8  13.0 - 17.1 g/dL   HCT 16.1  09.6 - 04.5 %   Platelets 224  140 - 400 10e3/uL   MCV 88.8  79.3 - 98.0 fL   MCH 29.7  27.2 - 33.4 pg   MCHC 33.5  32.0 - 36.0 g/dL   RBC 4.09  8.11 - 9.14 10e6/uL   RDW 13.8  11.0 - 14.6 %   lymph# 2.0  0.9 - 3.3 10e3/uL   MONO# 0.2  0.1 - 0.9 10e3/uL   Eosinophils Absolute 0.2  0.0 - 0.5 10e3/uL   Basophils Absolute  0.0  0.0 - 0.1 10e3/uL   NEUT% 52.6  39.0 - 75.0 %   LYMPH% 38.6  14.0 - 49.0 %   MONO% 4.1  0.0 - 14.0 %   EOS% 4.5  0.0 - 7.0 %   BASO% 0.2  0.0 - 2.0 %  COMPREHENSIVE METABOLIC PANEL (CC13)     Status: None   Collection Time    06/30/13  8:43 AM      Result Value Range   Sodium 138  136 - 145 mEq/L   Potassium 4.3  3.5 - 5.1 mEq/L   Chloride 103  98 - 109 mEq/L   CO2 27  22 - 29 mEq/L   Glucose 107  70 - 140 mg/dl   BUN 40.910.1  7.0 - 81.126.0 mg/dL   Creatinine 0.9  0.7 - 1.3 mg/dL   Total Bilirubin 9.140.49  0.20 - 1.20 mg/dL   Alkaline Phosphatase 86  40 - 150 U/L   AST 25  5 - 34 U/L   ALT 26   0 - 55 U/L   Total Protein 7.7  6.4 - 8.3 g/dL   Albumin 4.0  3.5 - 5.0 g/dL   Calcium 78.210.0  8.4 - 95.610.4 mg/dL   Anion Gap 9  3 - 11 mEq/L    Lab Results  Component Value Date   WBC 5.1 06/30/2013   HGB 14.8 06/30/2013   HCT 44.2 06/30/2013   MCV 88.8 06/30/2013   PLT 224 06/30/2013   ASSESSMENT & PLAN:  #1 hepatic vein thrombosis He is tolerating treatment well apart from mild bleeding. I recommend 6 months of treatment until end of June this year and he agreed #2 intermittent hemorrhoidal bleeding I recommend he increase fiber intake and use stool softener as needed. All questions were answered. The patient knows to call the clinic with any problems, questions or concerns. No barriers to learning was detected.  I spent 15 minutes counseling the patient face to face. The total time spent in the appointment was 20 minutes and more than 50% was on counseling.     Galo Sayed, MD 06/30/2013 9:48 AM

## 2013-10-27 ENCOUNTER — Ambulatory Visit: Payer: BC Managed Care – PPO | Admitting: Hematology and Oncology

## 2013-10-27 ENCOUNTER — Other Ambulatory Visit: Payer: BC Managed Care – PPO

## 2019-08-24 ENCOUNTER — Emergency Department (HOSPITAL_COMMUNITY)
Admission: EM | Admit: 2019-08-24 | Discharge: 2019-08-24 | Disposition: A | Discharge status: Home / Self Care | Payer: BLUE CROSS/BLUE SHIELD | Attending: Emergency Medicine | Admitting: Emergency Medicine

## 2019-08-24 ENCOUNTER — Emergency Department (HOSPITAL_COMMUNITY): Payer: BLUE CROSS/BLUE SHIELD

## 2019-08-24 ENCOUNTER — Encounter (HOSPITAL_COMMUNITY): Payer: Self-pay | Admitting: Emergency Medicine

## 2019-08-24 ENCOUNTER — Other Ambulatory Visit: Payer: Self-pay

## 2019-08-24 DIAGNOSIS — R0789 Other chest pain: Secondary | ICD-10-CM | POA: Diagnosis not present

## 2019-08-24 DIAGNOSIS — R079 Chest pain, unspecified: Secondary | ICD-10-CM | POA: Diagnosis present

## 2019-08-24 HISTORY — DX: Budd-Chiari syndrome: I82.0

## 2019-08-24 LAB — BASIC METABOLIC PANEL WITH GFR
Anion gap: 10 (ref 5–15)
BUN: 10 mg/dL (ref 6–20)
CO2: 27 mmol/L (ref 22–32)
Calcium: 9.2 mg/dL (ref 8.9–10.3)
Chloride: 102 mmol/L (ref 98–111)
Creatinine, Ser: 1.01 mg/dL (ref 0.61–1.24)
GFR calc Af Amer: >60 mL/min
GFR calc non Af Amer: >60 mL/min
Glucose, Bld: 103 mg/dL — ABNORMAL HIGH (ref 70–99)
Potassium: 4.1 mmol/L (ref 3.5–5.1)
Sodium: 139 mmol/L (ref 135–145)

## 2019-08-24 LAB — TROPONIN I (HIGH SENSITIVITY)
Troponin I (High Sensitivity): 17 ng/L (ref <18)
Troponin I (High Sensitivity): 18 ng/L — ABNORMAL HIGH (ref <18)

## 2019-08-24 LAB — CBC
HCT: 44.8 % (ref 39.0–52.0)
Hemoglobin: 14.3 g/dL (ref 13.0–17.0)
MCH: 29.1 pg (ref 26.0–34.0)
MCHC: 31.9 g/dL (ref 30.0–36.0)
MCV: 91.1 fL (ref 80.0–100.0)
Platelets: 244 10*3/uL (ref 150–400)
RBC: 4.92 MIL/uL (ref 4.22–5.81)
RDW: 14 % (ref 11.5–15.5)
WBC: 6.7 10*3/uL (ref 4.0–10.5)
nRBC: 0 % (ref 0.0–0.2)

## 2019-08-24 MED ORDER — SODIUM CHLORIDE 0.9% FLUSH: 3.0000 mL | Freq: Once | INTRAVENOUS | Status: DC

## 2019-08-24 MED ORDER — IOHEXOL 350 MG/ML SOLN
100.0000 mL | Freq: Once | INTRAVENOUS | Status: AC | PRN
  Administered 2019-08-24: 14:00:00 100 mL via INTRAVENOUS

## 2019-08-24 NOTE — ED Notes (Signed)
IV team consult ordered because unable to obtain IV site for CT angio

## 2019-08-24 NOTE — ED Provider Notes (Signed)
[facility] EMERGENCY DEPARTMENT Provider Note   CSN: [id number] Arrival date & time: 08/24/19  1046     History Chief Complaint  Patient presents with  . Chest Pain    Vincent Mckinney is a 34 y.o. male.  HPI Patient presents to the emergency department with chest discomfort this been ongoing over the last week.  The patient states has been Associated with some increase in heart rate and he did feel intermittently short of breath.  The patient states that the shortness of breath feels worse when he lies down at night but otherwise no aggravating factors.  The patient states he did have 2 Coumadin tablets left and he took this.  The patient states that the pain is constant.  The patient denies  headache,blurred vision, neck pain, fever, cough, weakness, numbness, dizziness, anorexia, edema, abdominal pain, nausea, vomiting, diarrhea, rash, back pain, dysuria, hematemesis, bloody stool, near syncope, or syncope. Past Medical History:  Diagnosis Date  . Hepatic vein thrombosis [facility])     Patient Active Problem List   Diagnosis Date Noted  . 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 Abdominal pain 05/14/2013  . 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 No pertinent surgical history.     No family history on file.  Social History   Tobacco Use  . Smoking status: Never Smoker  . Smokeless tobacco: Never Used  Substance Use Topics  . Alcohol use: No  . Drug use: No    Home Medications Prior to Admission medications   Medication Sig Start Date End Date Taking? Authorizing Provider  GNP GARLIC EXTRACT PO Take 1 capsule by mouth daily.    [provider]  Omega-3 Fatty Acids (OMEGA 3 PO) Take 1,600 mg by mouth daily. Takes 1600mg  daily. 1Tablespoon = 1600mg .    [provider]  Rivaroxaban (XARELTO) 20 MG TABS tablet Take 1 tablet (20 mg total) by mouth daily with supper. 06/02/13   , MD    Allergies      Patient has no known allergies.  Review of Systems   Review of Systems All other systems negative except as documented in the HPI. All pertinent positives and negatives as reviewed in the HPI. Physical Exam Updated Vital Signs BP 131/79   Pulse 72   Temp 98.2 F (36.8 C) (Oral)   Resp 16   Ht 6' (1.829 m)   Wt (!) 158.8 kg   SpO2 99%   BMI 47.47 kg/m   Physical Exam Vitals and nursing note reviewed.  Constitutional:      General: He is not in acute distress.    Appearance: He is well-developed.  HENT:     Head: Normocephalic and atraumatic.  Eyes:     Pupils: Pupils are equal, round, and reactive to light.  Cardiovascular:     Rate and Rhythm: Normal rate and regular rhythm.     Heart sounds: Normal heart sounds. No murmur. No friction rub. No gallop.   Pulmonary:     Effort: Pulmonary effort is normal. No respiratory distress.     Breath sounds: Normal breath sounds. No wheezing.  Chest:     Chest wall: No tenderness.  Abdominal:     General: Bowel sounds are normal. There is no distension.     Palpations: Abdomen is soft.     Tenderness: There is no abdominal tenderness.  Musculoskeletal:     Cervical back: Normal range of motion and neck supple.  Skin:  General: Skin is warm and dry.     Capillary Refill: Capillary refill takes less than 2 seconds.     Findings: No erythema or rash.  Neurological:     Mental Status: He is alert and oriented to person, place, and time.     Motor: No abnormal muscle tone.     Coordination: Coordination normal.  Psychiatric:        Behavior: Behavior normal.     ED Results / Procedures / Treatments   Labs (all labs ordered are listed, but only abnormal results are displayed) Labs Reviewed  BASIC METABOLIC PANEL - Abnormal; Notable for the following components:      Result Value   Glucose, Bld 103 (*)    All other components within normal limits  TROPONIN I (HIGH SENSITIVITY) - Abnormal; Notable for the following  components:   Troponin I (High Sensitivity) 18 (*)    All other components within normal limits  CBC  TROPONIN I (HIGH SENSITIVITY)    EKG None  Radiology DG Chest 2 View  Result Date: 08/24/2019 CLINICAL DATA:  Chest pain and shortness of breath. EXAM: CHEST - 2 VIEW COMPARISON:  None. FINDINGS: The cardiac silhouette, mediastinal and hilar contours are normal. The lungs are clear. No pleural effusions. No pulmonary lesions. The bony thorax is intact. IMPRESSION: No acute cardiopulmonary findings. Electronically Signed   By: Marijo Sanes M.D.   On: 08/24/2019 11:39   CT Angio Chest PE W/Cm &/Or Wo Cm  Result Date: 08/24/2019 CLINICAL DATA:  Chest pain. Shortness of breath. Positive D-dimer. EXAM: CT ANGIOGRAPHY CHEST WITH CONTRAST TECHNIQUE: Multidetector CT imaging of the chest was performed using the standard protocol during bolus administration of intravenous contrast. Multiplanar CT image reconstructions and MIPs were obtained to evaluate the vascular anatomy. CONTRAST:  125mL OMNIPAQUE IOHEXOL 350 MG/ML SOLN COMPARISON:  None. FINDINGS: Cardiovascular: Satisfactory opacification of the pulmonary arteries to the segmental level. No evidence of pulmonary embolism. Normal heart size. No pericardial effusion. Mediastinum/Nodes: No enlarged mediastinal, hilar, or axillary lymph nodes. Thyroid gland, trachea, and esophagus demonstrate no significant findings. Lungs/Pleura: Lungs are clear. No pleural effusion or pneumothorax. Upper Abdomen: No acute abnormality. Musculoskeletal: No chest wall abnormality. No acute or significant osseous findings. Review of the MIP images confirms the above findings. IMPRESSION: No evidence of pulmonary embolus or other acute abnormality within the thorax. Electronically Signed   By: Fidela Salisbury M.D.   On: 08/24/2019 14:07    Procedures Procedures (including critical care time)  Medications Ordered in ED Medications  sodium chloride flush (NS) 0.9 %  injection 3 mL (has no administration in time range)  iohexol (OMNIPAQUE) 350 MG/ML injection 100 mL (100 mLs Intravenous Contrast Given 08/24/19 1354)    ED Course  I have reviewed the triage vital signs and the nursing notes.  Pertinent labs & imaging results that were available during my care of the patient were reviewed by me and considered in my medical decision making (see chart for details).    MDM Rules/Calculators/A&P                      The patient does not have any pulmonary embolus noted on the CT scan of his chest.  The patient does not have any signs of heart failure.  The patient did have a troponin that was 18 but his first 1 was 17 and I do not feel that this is significant based on the fact that it has  been ongoing and constant for a week.  The patient will be given cardiology follow-up for the chest discomfort.  Have advised the patient plan and all questions were answered.  Patient agrees this plan as well.  I have advised him that if his condition worsens he will need to come back to the emergency department for further evaluation. Final Clinical Impression(s) / ED Diagnoses Final diagnoses:  None    Rx / DC Orders ED Discharge Orders    None       Charlestine Night, PA-C 08/24/19 1539    Arby Barrette, MD 09/02/19 1512

## 2019-08-24 NOTE — ED Triage Notes (Signed)
C/o SOB, rapid HR, lightheadedness, and tightness to center of chest x 1 week.  States SOB and pain is worse when lying down at night.  Pt concerned that he has a blood clot.  Reports history of hepatic vein thrombosis.

## 2019-08-24 NOTE — Discharge Instructions (Addendum)
Follow-up with the cardiologist provided.  Return here as needed for any worsening in your condition.  You can take Tylenol and Motrin for any discomfort.

## 2019-11-16 ENCOUNTER — Other Ambulatory Visit: Payer: Self-pay

## 2019-11-16 ENCOUNTER — Encounter (HOSPITAL_COMMUNITY): Payer: Self-pay | Admitting: Emergency Medicine

## 2019-11-16 ENCOUNTER — Emergency Department (HOSPITAL_COMMUNITY)
Admission: EM | Admit: 2019-11-16 | Discharge: 2019-11-16 | Disposition: A | Discharge status: Home / Self Care | Payer: BLUE CROSS/BLUE SHIELD | Attending: Emergency Medicine | Admitting: Emergency Medicine

## 2019-11-16 DIAGNOSIS — Y93E8 Activity, other personal hygiene: Secondary | ICD-10-CM | POA: Insufficient documentation

## 2019-11-16 DIAGNOSIS — Z79899 Other long term (current) drug therapy: Secondary | ICD-10-CM | POA: Diagnosis not present

## 2019-11-16 DIAGNOSIS — H66002 Acute suppurative otitis media without spontaneous rupture of ear drum, left ear: Secondary | ICD-10-CM | POA: Insufficient documentation

## 2019-11-16 DIAGNOSIS — T162XXA Foreign body in left ear, initial encounter: Secondary | ICD-10-CM

## 2019-11-16 DIAGNOSIS — X58XXXA Exposure to other specified factors, initial encounter: Secondary | ICD-10-CM | POA: Insufficient documentation

## 2019-11-16 DIAGNOSIS — H9202 Otalgia, left ear: Secondary | ICD-10-CM | POA: Diagnosis present

## 2019-11-16 DIAGNOSIS — Y929 Unspecified place or not applicable: Secondary | ICD-10-CM | POA: Diagnosis not present

## 2019-11-16 DIAGNOSIS — Y998 Other external cause status: Secondary | ICD-10-CM | POA: Diagnosis not present

## 2019-11-16 MED ORDER — AMOXICILLIN 875 MG PO TABS: 875.0000 mg | ORAL_TABLET | Freq: Two times a day (BID) | ORAL | 0 refills | Status: DC

## 2019-11-16 NOTE — Discharge Instructions (Signed)
Please read and follow all provided instructions.  Your diagnoses today include:  1. Acute suppurative otitis media of left ear without spontaneous rupture of tympanic membrane, recurrence not specified   2. Foreign body of left ear, initial encounter     Tests performed today include:  Vital signs. See below for your results today.   Medications prescribed:   Amoxicillin - antibiotic  You have been prescribed an antibiotic medicine: take the entire course of medicine even if you are feeling better. Stopping early can cause the antibiotic not to work.  Take any prescribed medications only as directed.  Home care instructions:  Follow any educational materials contained in this packet.  BE VERY CAREFUL not to take multiple medicines containing Tylenol (also called acetaminophen). Doing so can lead to an overdose which can damage your liver and cause liver failure and possibly death.   Follow-up instructions: Please follow-up with your primary care provider in the next 3 days for further evaluation of your symptoms.   Return instructions:   Please return to the Emergency Department if you experience worsening symptoms.   Please return if you have any other emergent concerns.  Additional Information:  Your vital signs today were: BP (!) 142/114 (BP Location: Left Arm)    Pulse 85    Temp 98.3 F (36.8 C) (Oral)    Resp 18    Ht 6' (1.829 m)    Wt (!) 158.8 kg    SpO2 96%    BMI 47.47 kg/m  If your blood pressure (BP) was elevated above 135/85 this visit, please have this repeated by your doctor within one month. --------------

## 2019-11-16 NOTE — ED Triage Notes (Signed)
Pt states Q-tip broke off in L ear this morning.  C/o L ear pain.

## 2019-11-16 NOTE — ED Provider Notes (Signed)
[facility] EMERGENCY DEPARTMENT Provider Note   CSN: [id number] Arrival date & time: 11/16/19  0814     History Chief Complaint  Patient presents with  . Foreign Body in Ear    Vincent Mckinney is a 34 y.o. male.  Patient who states that he is "prone to ear infections" presents to the emergency department today with complaint of ear pain and foreign body.  Patient states that he woke up this morning feeling like he was getting an ear infection in his left ear.  He used a plastic Q-tip to try to clean the ear and the tip of the plastic Q-tip broke and lodged in his ear canal.  Patient states that his wife tried to remove it with tweezers however ended up pushing the piece deeper into the ear canal.  He had associated pain.  No fevers, nausea, vomiting.  No other URI symptoms.          Past Medical History:  Diagnosis Date  . 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 Abdominal pain 05/14/2013  . 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 No pertinent surgical history.     No family history on file.  Social History   Tobacco Use  . Smoking status: Never Smoker  . Smokeless tobacco: Never Used  Substance Use Topics  . Alcohol use: No  . Drug use: No    Home Medications Prior to Admission medications   Medication Sig Start Date End Date Taking? Authorizing Provider  amoxicillin (AMOXIL) 875 MG tablet Take 1 tablet (875 mg total) by mouth 2 (two) times daily. 11/16/19   Renne Crigler, PA-C  GNP GARLIC EXTRACT PO Take 1 capsule by mouth daily.    [provider]  Omega-3 Fatty Acids (OMEGA 3 PO) Take 1,600 mg by mouth daily. Takes 1600mg  daily. 1Tablespoon = 1600mg .    [provider]  Rivaroxaban (XARELTO) 20 MG TABS tablet Take 1 tablet (20 mg total) by mouth daily with supper. 06/02/13   , MD    Allergies      Patient has no known allergies.  Review of Systems   Review of Systems  Constitutional: Negative for chills, fatigue and fever.  HENT: Positive for ear pain. Negative for congestion, ear discharge, rhinorrhea, sinus pressure and sore throat.   Eyes: Negative for redness.  Respiratory: Negative for cough.   Gastrointestinal: Negative for nausea and vomiting.    Physical Exam Updated Vital Signs BP (!) 142/114 (BP Location: Left Arm)   Pulse 85   Temp 98.3 F (36.8 C) (Oral)   Resp 18   Ht 6' (1.829 m)   Wt (!) 158.8 kg   SpO2 96%   BMI 47.47 kg/m   Physical Exam Vitals and nursing note reviewed.  Constitutional:      Appearance: He is well-developed.  HENT:     Head: Normocephalic and atraumatic.     Right Ear: Tympanic membrane, ear canal and external ear normal.     Left Ear: Tenderness present. Tympanic membrane is injected and bulging.     Ears:     Comments: L ear canal slightly swollen and inflamed.  Eyes:     Conjunctiva/sclera: Conjunctivae normal.  Pulmonary:     Effort: No respiratory distress.  Musculoskeletal:     Cervical back: Normal range of motion and neck supple.  Skin:  General: Skin is warm and dry.  Neurological:     Mental Status: He is alert.     ED Results / Procedures / Treatments   Labs (all labs ordered are listed, but only abnormal results are displayed) Labs Reviewed - No data to display  EKG None  Radiology No results found.  Procedures .Foreign Body Removal  Date/Time: 11/16/2019 10:45 AM Performed by: Carlisle Cater, PA-C Authorized by: Carlisle Cater, PA-C  Body area: ear Location details: left ear Localization method: ENT speculum Removal mechanism: alligator forceps Complexity: simple 1 objects recovered. Objects recovered: plastic end of Qtip Post-procedure assessment: foreign body removed Patient tolerance: patient tolerated the procedure well with no immediate complications   (including critical care  time)  Medications Ordered in ED Medications - No data to display  ED Course  I have reviewed the triage vital signs and the nursing notes.  Pertinent labs & imaging results that were available during my care of the patient were reviewed by me and considered in my medical decision making (see chart for details).  Patient seen and examined.  Foreign body retrieved without complication.  TM exam consistent with otitis media.  Patient will be treated for this.  Encourage PCP follow-up as needed.  Vital signs reviewed and are as follows: BP (!) 142/114 (BP Location: Left Arm)   Pulse 85   Temp 98.3 F (36.8 C) (Oral)   Resp 18   Ht 6' (1.829 m)   Wt (!) 158.8 kg   SpO2 96%   BMI 47.47 kg/m      MDM Rules/Calculators/A&P                          Patient awoke this morning with ear pain, concern for ear infection.  He tried to clean the ear with a Q-tip and sustained a foreign body.  This was removed.  TM exam consistent with otitis media and patient will be treated with amox.    Final Clinical Impression(s) / ED Diagnoses Final diagnoses:  Acute suppurative otitis media of left ear without spontaneous rupture of tympanic membrane, recurrence not specified  Foreign body of left ear, initial encounter    Rx / DC Orders ED Discharge Orders         Ordered    amoxicillin (AMOXIL) 875 MG tablet  2 times daily     Discontinue  Reprint     11/16/19 1037           Carlisle Cater, PA-C 11/16/19 [place], [place], MD 11/16/19 1549

## 2019-11-23 ENCOUNTER — Other Ambulatory Visit: Payer: Self-pay

## 2019-11-23 ENCOUNTER — Emergency Department (HOSPITAL_COMMUNITY)
Admission: EM | Admit: 2019-11-23 | Discharge: 2019-11-23 | Disposition: A | Discharge status: Home / Self Care | Payer: BLUE CROSS/BLUE SHIELD | Attending: Emergency Medicine | Admitting: Emergency Medicine

## 2019-11-23 ENCOUNTER — Other Ambulatory Visit: Payer: Self-pay | Admitting: Cardiology

## 2019-11-23 ENCOUNTER — Emergency Department (HOSPITAL_COMMUNITY): Payer: BLUE CROSS/BLUE SHIELD

## 2019-11-23 ENCOUNTER — Encounter (HOSPITAL_COMMUNITY): Payer: Self-pay | Admitting: Emergency Medicine

## 2019-11-23 ENCOUNTER — Emergency Department (HOSPITAL_BASED_OUTPATIENT_CLINIC_OR_DEPARTMENT_OTHER): Payer: BLUE CROSS/BLUE SHIELD

## 2019-11-23 DIAGNOSIS — R072 Precordial pain: Secondary | ICD-10-CM

## 2019-11-23 DIAGNOSIS — R778 Other specified abnormalities of plasma proteins: Secondary | ICD-10-CM

## 2019-11-23 DIAGNOSIS — Z79899 Other long term (current) drug therapy: Secondary | ICD-10-CM | POA: Diagnosis not present

## 2019-11-23 DIAGNOSIS — R0789 Other chest pain: Secondary | ICD-10-CM | POA: Diagnosis not present

## 2019-11-23 DIAGNOSIS — I10 Essential (primary) hypertension: Secondary | ICD-10-CM | POA: Diagnosis not present

## 2019-11-23 DIAGNOSIS — R079 Chest pain, unspecified: Secondary | ICD-10-CM | POA: Diagnosis not present

## 2019-11-23 LAB — ECHOCARDIOGRAM COMPLETE
Height: 72 in
Weight: 5601.45 oz

## 2019-11-23 LAB — BASIC METABOLIC PANEL
Anion gap: 7 (ref 5–15)
BUN: 11 mg/dL (ref 6–20)
CO2: 28 mmol/L (ref 22–32)
Calcium: 9.1 mg/dL (ref 8.9–10.3)
Chloride: 102 mmol/L (ref 98–111)
Creatinine, Ser: 1.17 mg/dL (ref 0.61–1.24)
GFR calc Af Amer: >60 mL/min (ref >60)
GFR calc non Af Amer: >60 mL/min (ref >60)
Glucose, Bld: 106 mg/dL — ABNORMAL HIGH (ref 70–99)
Potassium: 4.3 mmol/L (ref 3.5–5.1)
Sodium: 137 mmol/L (ref 135–145)

## 2019-11-23 LAB — CBC
HCT: 41.2 % (ref 39.0–52.0)
Hemoglobin: 13.4 g/dL (ref 13.0–17.0)
MCH: 29.1 pg (ref 26.0–34.0)
MCHC: 32.5 g/dL (ref 30.0–36.0)
MCV: 89.6 fL (ref 80.0–100.0)
Platelets: 246 10*3/uL (ref 150–400)
RBC: 4.6 MIL/uL (ref 4.22–5.81)
RDW: 14.4 % (ref 11.5–15.5)
WBC: 8.1 10*3/uL (ref 4.0–10.5)
nRBC: 0 % (ref 0.0–0.2)

## 2019-11-23 LAB — TROPONIN I (HIGH SENSITIVITY)
Troponin I (High Sensitivity): 29 ng/L — ABNORMAL HIGH (ref <18)
Troponin I (High Sensitivity): 26 ng/L — ABNORMAL HIGH (ref <18)
Troponin I (High Sensitivity): 23 ng/L — ABNORMAL HIGH (ref <18)

## 2019-11-23 LAB — D-DIMER, QUANTITATIVE: D-Dimer, Quant: 0.41 ug/mL-FEU (ref 0.00–0.50)

## 2019-11-23 MED ORDER — SODIUM CHLORIDE 0.9% FLUSH: 3.0000 mL | Freq: Once | INTRAVENOUS | Status: DC

## 2019-11-23 MED ORDER — PERFLUTREN LIPID MICROSPHERE
1.0000 mL | INTRAVENOUS | Status: AC | PRN
  Administered 2019-11-23: 2 mL via INTRAVENOUS
  Filled 2019-11-23: qty 10

## 2019-11-23 NOTE — ED Provider Notes (Signed)
34 yo M that I received in signout from Dr. Bebe Shaggy.  Briefly the patient had been having chest pain that sounded somewhat typical that occurred at night.  This is a second visit to the ED and he was not able to follow-up in between the first and second visit.  Plan for discussion with cardiology and EP consultation.  Cards is evaluated the patient at bedside and feel this is unlikely to be cardiac ischemia.  He had a bedside echo performed in the ED that did not show any wall motion abnormalities or a paracardial effusion.  He now has 3 serial troponins that are negative.  He had a D-dimer ordered by cardiology that was negative.  Will discharge the patient home.  Cardiology follow-up.   Melene Plan, DO 11/23/19 1400

## 2019-11-23 NOTE — Progress Notes (Signed)
  Echocardiogram 2D Echocardiogram has been performed.  Vincent Mckinney 11/23/2019, 12:43 PM

## 2019-11-23 NOTE — Discharge Instructions (Signed)
It seems unlikely that you are having a heart attack based on the lab work obtained today.  The cardiologist recommended follow-up with them in the office.  They wanted you to obtain a blood pressure cuff and to do a treadmill stress test prior to your next visit.  Please discuss this with them on the phone when you call to make an appointment.  Please return to the emergency department for worsening pain.

## 2019-11-23 NOTE — Consult Note (Addendum)
Cardiology Consultation:   Patient ID: Vincent Mckinney MRN: [id number]; DOB: 04-19-1986  Admit date: 11/23/2019 Date of Consult: 11/23/2019  Primary Care Provider: Patient, No Pcp Per [facility] Cardiologist: No primary care provider on file. new [facility] Electrophysiologist:  None    Patient Profile:   Vincent Mckinney is a 34 y.o. male with a hx of exercise induced asthma, thrombosis in portal vein who is being seen today for the evaluation of chest pain at the request of Dr. Adela Lank .  History of Present Illness:   Vincent Mckinney with no cardiac issues, did have portal vein thrombosis  in 2014 and was treated with coumadin, FH of blood clots, (Protein C was 70, protein S normal, antithrombin III was normal, lupus antigen was normal,  Homocystine was 10.5, factor 5 leiden was negative, neg prothrombin gene mutation also JAK2 genotyp R was not detected) NO LONGER ON ANTICOAGULATION presented to ER early AM today with SOB, dizziness, increases when lyng down  -episodes occur at night.   Also had chest pain on 08/24/19 troponins 17 and 18 and neg CTA for PE at that time. No abnormality.      His pain occurs at night when he lies down and he does attribute to meals. First episode on Friday but yesterday he worked out in gym without problems.  Then last night it returned mid sternal pain with SOB no diaphoresis he felt his HR was up also and very mild nausea so he came to ER.  It began after meals.    EKG:  The EKG was personally reviewed and demonstrates:  SR with LVH  Telemetry:  Telemetry was personally reviewed and demonstrates:  SR NA 137 K+ 4.3, Cr 1.17 Hs troponin 29, 28 Hgb 13.4, Plts 246  2V CXR normal.  No chest pain now.  Has not had COVID and has not received vaccine   BP 142/90 to 150/97   Past Medical History:  Diagnosis Date  . Hepatic vein thrombosis (HCC)     History reviewed. No pertinent surgical history.   Home Medications:  Prior to Admission  medications   NO HOME MEDICATIONS   Medication Sig Start Date End Date Taking? Authorizing Provider  amoxicillin (AMOXIL) 875 MG tablet Take 1 tablet (875 mg total) by mouth 2 (two) times daily. 11/16/19   Renne Crigler, PA-C  GNP GARLIC EXTRACT PO Take 1 capsule by mouth daily.    [provider]  Omega-3 Fatty Acids (OMEGA 3 PO) Take 1,600 mg by mouth daily. Takes 1600mg  daily. 1Tablespoon = 1600mg .    [provider]  Rivaroxaban (XARELTO) 20 MG TABS tablet Take 1 tablet (20 mg total) by mouth daily with supper. 06/02/13 11/23/19  07/31/13, MD    Inpatient Medications: Scheduled Meds: . sodium chloride flush  3 mL Intravenous Once   Continuous Infusions:  PRN Meds:   Allergies:   No Known Allergies  Social History:   Social History   Socioeconomic History  . Marital status: Married    Spouse name: Not on file  . Number of children: Not on file  . Years of education: Not on file  . Highest education level: Not on file  Occupational History  . Not on file  Tobacco Use  . Smoking status: Never Smoker  . Smokeless tobacco: Never Used  Substance and Sexual Activity  . Alcohol use: No  . Drug use: No  . Sexual activity: Not on file  Other Topics Concern  . Not on  file  Social History Narrative  . Not on file   Social Determinants of Health   Financial Resource Strain:   . Difficulty of Paying Living Expenses:   Food Insecurity:   . Worried About Programme researcher, broadcasting/film/videounning Out of Food in the Last Year:   . Baristaan Out of Food in the Last Year:   Transportation Needs:   . Freight forwarderLack of Transportation (Medical):   Marland Kitchen. Lack of Transportation (Non-Medical):   Physical Activity:   . Days of Exercise per Week:   . Minutes of Exercise per Session:   Stress:   . Feeling of Stress :   Social Connections:   . Frequency of Communication with Friends and Family:   . Frequency of Social Gatherings with Friends and Family:   . Attends Religious Services:   . Active Member of Clubs or  Organizations:   . Attends BankerClub or Organization Meetings:   Marland Kitchen. Marital Status:   Intimate Partner Violence:   . Fear of Current or Ex-Partner:   . Emotionally Abused:   Marland Kitchen. Physically Abused:   . Sexually Abused:     Family History:   Family History  Problem Relation Age of Onset  . Diabetes Mother   . Diabetes Father   . Hypertension Father      ROS:  Please see the history of present illness.  General:no colds or fevers, no weight changes Skin:no rashes or ulcers HEENT:no blurred vision, no congestion CV:see HPI PUL:see HPI GI:no diarrhea constipation or melena, no indigestion GU:no hematuria, no dysuria MS:no joint pain, no claudication Neuro:no syncope, no lightheadedness Endo:no diabetes, no thyroid disease  All other ROS reviewed and negative.     Physical Exam/Data:   Vitals:   11/23/19 0729 11/23/19 0744 11/23/19 0759 11/23/19 0814  BP: (!) 123/58 (!) 142/93 (!) 144/97 (!) 150/97  Pulse: 74 69 73 75  Resp: 10 16 17 15   Temp:      TempSrc:      SpO2: 99% 98% 97% 99%  Weight:      Height:       No intake or output data in the 24 hours ending 11/23/19 0921 Last 3 Weights 11/23/2019 11/16/2019 08/24/2019  Weight (lbs) 350 lb 1.5 oz 350 lb 350 lb  Weight (kg) 158.8 kg 158.759 kg 158.759 kg     Body mass index is 47.48 kg/m.  General:  Well nourished, well developed, in no acute distress HEENT: normal Lymph: no adenopathy Neck: no JVD Endocrine:  No thryomegaly Vascular: No carotid bruits; pedal pulses 2+ bilaterally  Cardiac:  normal S1, S2; RRR; no murmur gallup rub or click Lungs:  clear to auscultation bilaterally, no wheezing, rhonchi or rales  Abd: soft, nontender, no hepatomegaly  Ext: no edema Musculoskeletal:  No deformities, BUE and BLE strength normal and equal Skin: warm and dry  Neuro:  Alert and oriented X 3 MAE follows commands, no focal abnormalities noted Psych:  Normal affect   Relevant CV Studies: Echo ordered  Laboratory  Data:  High Sensitivity Troponin:   Recent Labs  Lab 11/23/19 0030 11/23/19 0237  TROPONINIHS 29* 26*     Chemistry Recent Labs  Lab 11/23/19 0030  NA 137  K 4.3  CL 102  CO2 28  GLUCOSE 106*  BUN 11  CREATININE 1.17  CALCIUM 9.1  GFRNONAA >60  GFRAA >60  ANIONGAP 7    No results for input(s): PROT, ALBUMIN, AST, ALT, ALKPHOS, BILITOT in the last 168 hours. Hematology Recent Labs  Lab  11/23/19 0030  WBC 8.1  RBC 4.60  HGB 13.4  HCT 41.2  MCV 89.6  MCH 29.1  MCHC 32.5  RDW 14.4  PLT 246   BNPNo results for input(s): BNP, PROBNP in the last 168 hours.  DDimer No results for input(s): DDIMER in the last 168 hours.   Radiology/Studies:  DG Chest 2 View  Result Date: 11/23/2019 CLINICAL DATA:  Mid chest pain. EXAM: CHEST - 2 VIEW COMPARISON:  08/24/2019 radiograph and CT FINDINGS: The cardiomediastinal contours are normal. The lungs are clear. Pulmonary vasculature is normal. No consolidation, pleural effusion, or pneumothorax. Chronic spurring of the right acromioclavicular joint. No acute osseous abnormalities are seen. IMPRESSION: Negative radiographs of the chest. Electronically Signed   By: Narda Rutherford M.D.   On: 11/23/2019 01:00       3   Assessment and Plan:   1. Chest pain none with exercises, only when lies down at night after meal.  May be GERD, with elevated troponin will check echo. Has not had COVID virus or vaccine. Possible pericarditis or myocarditis but during the day not pain at all.  DR. Mahoganie Basher to see. No FH of CAD.  2. Hx of Portal vein thrombosis no longer on anticoagulation 3. HTN this admit -        For questions or updates, please contact [facility] Please consult [email or web address] for contact info under    Signed, Nada Boozer, NP  11/23/2019 9:21 AM   History and all data above reviewed.  Patient examined.  I agree with the findings as above.  He has no prior cardiac history.  He said that he thinks it was Friday when he  had chest pain.  It happened after eating.  He was lying down.  He has not had pain like this before.  He had no radiation to his jaw or to his arms.  It was more of a discomfort or pressure lasting as long as he was lying flat.  He was a little bit short of breath.  He had some very mild nausea.  He was able to go to the gym the next day and get on the elliptical and bike and not bring on this pain.  However, he had recurrent discomfort lying flat.  He said this is not like the previous blood clot he had.  He is otherwise felt well.  He finally came to the ER to make sure it was not his heart.  He does have any nonspecific flat troponin.  He has had no previous work-up.  He denies any PND or orthopnea.  He does snore and thinks he has sleep apnea.  He is hypertensive in the emergency room but does not know of this being a problem at home.   The patient exam reveals COR:RRR  ,  Lungs: Clear  ,  Abd: Positive bowel sounds, no rebound no guarding, Ext No edema  .  All available labs, radiology testing, previous records reviewed. Agree with documented assessment and plan.   CHEST PAIN: Atypical.  He might have risk factors and that he does not have a lipid profile that we know of.  He does not have a diagnosis of diabetes.  He probably does have hypertension.  I like to check an echocardiogram before he leaves the emergency room to make sure there is no regional wall motion abnormalities.  I do not strongly suspect pericarditis as it is no EKG changes.  We could look for effusion as well  with echo.  Provided this is okay I would send him home for a POET (Plain Old Exercise Treadmill).  HTN: I discussed with him getting a blood pressure cuff goals of therapy.  We discussed weight loss with diet and exercise and salt restriction.  SNORING: He will ultimately need a sleep study.  RISK REDUCTION:   When he comes back for his POET (Plain Old Exercise Treadmill) he should have fasting lipid profile.  I would like to see him  in follow-up.  Fayrene Fearing Azaan Leask  11:05 AM  11/23/2019

## 2019-11-23 NOTE — ED Provider Notes (Signed)
[facility] EMERGENCY DEPARTMENT Provider Note   CSN: [id number] Arrival date & time: 11/23/19  0014     History Chief Complaint  Patient presents with  . Chest Pain    Vincent Mckinney is a 34 y.o. male.  The history is provided by the patient.  Chest Pain Pain location:  Substernal area Pain quality: pressure   Pain radiates to:  Does not radiate Pain severity:  Moderate Timing:  Constant Progression:  Resolved Chronicity:  New Associated symptoms: numbness, shortness of breath and weakness   Associated symptoms: no diaphoresis and no vomiting   Patient presents with chest pain.  He reports chest pain for the past 2 days.  Reports it usually occurs at night.  It is drifting off to sleep will have chest pressure and feels short of breath as well as nausea/weakness and numbness. It is not appear to occur during the daytime. He reports being active yesterday and working at the gym without any symptoms.  HPI: A 34 year old patient presents for evaluation of chest pain. Initial onset of pain was approximately 3-6 hours ago. The patient's chest pain is described as heaviness/pressure/tightness and is not worse with exertion. The patient complains of nausea. The patient's chest pain is middle- or left-sided, is not well-localized, is not sharp and does not radiate to the arms/jaw/neck. The patient denies diaphoresis. The patient has no history of stroke, has no history of peripheral artery disease, has not smoked in the past 90 days, denies any history of treated diabetes, has no relevant family history of coronary artery disease (first degree relative at less than age 17), is not hypertensive, has no history of hypercholesterolemia and does not have an elevated BMI (>=30).   Past Medical History:  Diagnosis Date  . Hepatic vein thrombosis [facility])     Patient Active Problem List   Diagnosis Date Noted  . 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 Abdominal pain  05/14/2013  . 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 No pertinent surgical history.     No family history on file.  Social History   Tobacco Use  . Smoking status: Never Smoker  . Smokeless tobacco: Never Used  Substance Use Topics  . Alcohol use: No  . Drug use: No    Home Medications Prior to Admission medications   Medication Sig Start Date End Date Taking? Authorizing Provider  amoxicillin (AMOXIL) 875 MG tablet Take 1 tablet (875 mg total) by mouth 2 (two) times daily. 11/16/19   Renne Crigler, PA-C  GNP GARLIC EXTRACT PO Take 1 capsule by mouth daily.    [provider]  Omega-3 Fatty Acids (OMEGA 3 PO) Take 1,600 mg by mouth daily. Takes 1600mg  daily. 1Tablespoon = 1600mg .    [provider]  Rivaroxaban (XARELTO) 20 MG TABS tablet Take 1 tablet (20 mg total) by mouth daily with supper. 06/02/13 11/23/19  07/31/13, MD    Allergies    Patient has no known allergies.  Review of Systems   Review of Systems  Constitutional: Negative for diaphoresis.  Respiratory: Positive for shortness of breath.   Cardiovascular: Positive for chest pain.  Gastrointestinal: Negative for vomiting.  Neurological: Positive for weakness and numbness.  All other systems reviewed and are negative.   Physical Exam Updated Vital Signs BP 129/84   Pulse 89   Temp 97.7 F (36.5 C) (Oral)   Resp (!) 23   Ht 1.829 m (6')   Wt (!) 158.8 kg  SpO2 98%   BMI 47.48 kg/m   Physical Exam CONSTITUTIONAL: Well developed/well nourished HEAD: Normocephalic/atraumatic EYES: EOMI/PERRL ENMT: Mucous membranes moist NECK: supple no meningeal signs SPINE/BACK:entire spine nontender CV: S1/S2 noted, no murmurs/rubs/gallops noted LUNGS: Lungs are clear to auscultation bilaterally, no apparent distress ABDOMEN: soft, nontender, no rebound or guarding, bowel sounds noted throughout abdomen, obese GU:no cva tenderness NEURO: Pt is awake/alert/appropriate, moves  all extremitiesx4.  No facial droop.  EXTREMITIES: pulses normal/equal, full ROM, no calf tenderness SKIN: warm, color normal PSYCH: no abnormalities of mood noted, alert and oriented to situation  ED Results / Procedures / Treatments   Labs (all labs ordered are listed, but only abnormal results are displayed) Labs Reviewed  BASIC METABOLIC PANEL - Abnormal; Notable for the following components:      Result Value   Glucose, Bld 106 (*)    All other components within normal limits  TROPONIN I (HIGH SENSITIVITY) - Abnormal; Notable for the following components:   Troponin I (High Sensitivity) 29 (*)    All other components within normal limits  TROPONIN I (HIGH SENSITIVITY) - Abnormal; Notable for the following components:   Troponin I (High Sensitivity) 26 (*)    All other components within normal limits  CBC    EKG EKG Interpretation  Date/Time:  Sunday November 23 2019 00:21:37 EDT Ventricular Rate:  90 PR Interval:  150 QRS Duration: 96 QT Interval:  392 QTC Calculation: 479 R Axis:   -17 Text Interpretation: Normal sinus rhythm Minimal voltage criteria for LVH, may be normal variant ( R in aVL ) Nonspecific T wave abnormality Prolonged QT Abnormal ECG No significant change since last tracing Confirmed by Zadie Rhine ([id number]) on 11/23/2019 5:32:10 AM   Radiology DG Chest 2 View  Result Date: 11/23/2019 CLINICAL DATA:  Mid chest pain. EXAM: CHEST - 2 VIEW COMPARISON:  08/24/2019 radiograph and CT FINDINGS: The cardiomediastinal contours are normal. The lungs are clear. Pulmonary vasculature is normal. No consolidation, pleural effusion, or pneumothorax. Chronic spurring of the right acromioclavicular joint. No acute osseous abnormalities are seen. IMPRESSION: Negative radiographs of the chest. Electronically Signed   By: Narda Rutherford M.D.   On: 11/23/2019 01:00    Procedures Procedures  Medications Ordered in ED Medications  sodium chloride flush (NS) 0.9 % injection  3 mL (has no administration in time range)    ED Course  I have reviewed the triage vital signs and the nursing notes.  Pertinent labs & imaging results that were available during my care of the patient were reviewed by me and considered in my medical decision making (see chart for details).    MDM Rules/Calculators/A&P HEAR Score: 2                        7:07 AM Patient presents for recurrent chest pain.  These episodes appear to occur at night with chest pressure and feeling short of breath. His history is somewhat atypical as he reports when he is active he does not have pain.  However he did have chest pain several months ago with elevated troponin, and tonight's episode is present with elevated troponin.  No acute EKG changes. Denies pleuritic pain, less likely PE Patient was never able to see cardiology. At signout to dr Adela Lank, f/u on cardiology recs  Previous h/o hepatic thrombosis, no longer on anticoagulants  Final Clinical Impression(s) / ED Diagnoses Final diagnoses:  None    Rx / DC Orders ED Discharge  Orders    None       Zadie Rhine, MD 11/23/19 [phone]

## 2019-11-23 NOTE — ED Triage Notes (Signed)
Pt c/o mid cp with sob and dizziness since yesterday, more intense when is lying down to sleep.

## 2019-11-23 NOTE — ED Notes (Signed)
Echo at bedside

## 2019-11-26 ENCOUNTER — Other Ambulatory Visit: Payer: Self-pay | Admitting: Cardiology

## 2019-11-26 DIAGNOSIS — R079 Chest pain, unspecified: Secondary | ICD-10-CM

## 2019-12-04 ENCOUNTER — Telehealth (HOSPITAL_COMMUNITY): Payer: Self-pay | Admitting: Cardiology

## 2019-12-04 NOTE — Telephone Encounter (Signed)
I called patient to schedule GXT and he declined to schedule at this time.  He is losing weight and exercising and trying to follow a healthy regimen and he will call us back if he starts to feel bad again. Order will be removed from the WQ and if he decides to have in the future we can reinstate the order.

## 2019-12-04 NOTE — Telephone Encounter (Signed)
Ok

## 2020-06-15 ENCOUNTER — Emergency Department (HOSPITAL_COMMUNITY)
Admission: EM | Admit: 2020-06-15 | Discharge: 2020-06-16 | Disposition: A | Discharge status: Home / Self Care | Payer: Self-pay | Attending: Emergency Medicine | Admitting: Emergency Medicine

## 2020-06-15 ENCOUNTER — Other Ambulatory Visit: Payer: Self-pay

## 2020-06-15 ENCOUNTER — Encounter (HOSPITAL_COMMUNITY): Payer: Self-pay

## 2020-06-15 DIAGNOSIS — R202 Paresthesia of skin: Secondary | ICD-10-CM | POA: Insufficient documentation

## 2020-06-15 DIAGNOSIS — R079 Chest pain, unspecified: Secondary | ICD-10-CM

## 2020-06-15 DIAGNOSIS — R072 Precordial pain: Secondary | ICD-10-CM | POA: Insufficient documentation

## 2020-06-15 DIAGNOSIS — Z7901 Long term (current) use of anticoagulants: Secondary | ICD-10-CM | POA: Insufficient documentation

## 2020-06-15 DIAGNOSIS — J45909 Unspecified asthma, uncomplicated: Secondary | ICD-10-CM | POA: Insufficient documentation

## 2020-06-15 DIAGNOSIS — R531 Weakness: Secondary | ICD-10-CM | POA: Insufficient documentation

## 2020-06-15 DIAGNOSIS — R0602 Shortness of breath: Secondary | ICD-10-CM | POA: Insufficient documentation

## 2020-06-15 DIAGNOSIS — Z86718 Personal history of other venous thrombosis and embolism: Secondary | ICD-10-CM | POA: Insufficient documentation

## 2020-06-15 DIAGNOSIS — R1013 Epigastric pain: Secondary | ICD-10-CM | POA: Insufficient documentation

## 2020-06-15 NOTE — ED Triage Notes (Signed)
Patient reports he woke up with chest pain and numbness and tingling in hands and legs, also with L shoulder pain, hx of same but states this time it is lasting longer, endorses he may have OSA but never dx.

## 2020-06-16 ENCOUNTER — Emergency Department (HOSPITAL_COMMUNITY): Payer: Self-pay

## 2020-06-16 LAB — BASIC METABOLIC PANEL
Anion gap: 11 (ref 5–15)
BUN: 8 mg/dL (ref 6–20)
CO2: 24 mmol/L (ref 22–32)
Calcium: 8.8 mg/dL — ABNORMAL LOW (ref 8.9–10.3)
Chloride: 100 mmol/L (ref 98–111)
Creatinine, Ser: 1.11 mg/dL (ref 0.61–1.24)
GFR, Estimated: >60 mL/min (ref >60)
Glucose, Bld: 106 mg/dL — ABNORMAL HIGH (ref 70–99)
Potassium: 3.9 mmol/L (ref 3.5–5.1)
Sodium: 135 mmol/L (ref 135–145)

## 2020-06-16 LAB — CBC
HCT: 42.1 % (ref 39.0–52.0)
Hemoglobin: 13.5 g/dL (ref 13.0–17.0)
MCH: 28.7 pg (ref 26.0–34.0)
MCHC: 32.1 g/dL (ref 30.0–36.0)
MCV: 89.6 fL (ref 80.0–100.0)
Platelets: 237 10*3/uL (ref 150–400)
RBC: 4.7 MIL/uL (ref 4.22–5.81)
RDW: 14.1 % (ref 11.5–15.5)
WBC: 7.9 10*3/uL (ref 4.0–10.5)
nRBC: 0 % (ref 0.0–0.2)

## 2020-06-16 LAB — TROPONIN I (HIGH SENSITIVITY)
Troponin I (High Sensitivity): 18 ng/L — ABNORMAL HIGH (ref <18)
Troponin I (High Sensitivity): 19 ng/L — ABNORMAL HIGH (ref <18)

## 2020-06-16 LAB — D-DIMER, QUANTITATIVE: D-Dimer, Quant: 0.44 ug/mL-FEU (ref 0.00–0.50)

## 2020-06-16 NOTE — Discharge Instructions (Signed)
The D-dimer today was normal without evidence of blood clot.  The blood test to the heart were improved today from the last time you were here and there is no sign of heart attack today.  The pain you are experiencing may be related to acid or change in foods or could be mildly from your asthma.  However if the symptoms are to continue it would be good to have a full cardiology evaluation.  If symptoms significantly worsen and you cannot catch her breath, or passing out, vomiting or any other concerns please return to the emergency room.

## 2020-06-16 NOTE — ED Provider Notes (Signed)
[facility] EMERGENCY DEPARTMENT Provider Note   CSN: [id number] Arrival date & time: 06/15/20  2332     History Chief Complaint  Patient presents with  . Chest Pain  . Numbness    Vincent Mckinney is a 35 y.o. male.  Patient is a 35 year old male with a prior history of portal vein thrombosis who completed a course of Coumadin who presents today with chest pain/discomfort with shortness of breath that started last night around 830 when he lay down to go to sleep.  Patient reports he tried to go to sleep but he was very uncomfortable because it felt like his air was being cut off.  It was slightly better when he got up but did not resolve with standing and walking around.  Because the symptoms continued he came to the emergency room.  Now he reports his symptoms are almost completely resolved.  He only notices the discomfort in the xiphoid and lower sternal area when he lays down.  It is not significantly worse with deep breaths.  While he was having the symptoms he felt a tingling in his legs and arms with maybe slight radiation of pain into the left shoulder.  He denies any back pain or abdominal pain.  He did not eat prior to laying down last night and has not noticed excessive heartburn symptoms.  He did try to uses an inhaler and states it provided maybe a few seconds of relief but nothing lasting.  He has not had recent cough, congestion, fever or Covid-like illness.  He reports that yesterday was a normal day.  He was active and able to do all the things he normally did and felt fine right before he laid down.  He reports he had a similar episode in July 2021 but did not get further follow-up and had not had recurrence until now.  He does not use tobacco or alcohol.  He has not been taking any new medications.  He denies any recent immobilization, surgeries or long travel.  The history is provided by the patient.  Chest Pain Pain location:  Substernal area and  epigastric Pain quality: aching   Pain radiates to:  L arm Pain severity:  Moderate Onset quality:  Sudden Duration:  5 hours Timing:  Constant Progression:  Waxing and waning Chronicity:  Recurrent Context comment:  Started after he laid down to go to sleep last night Relieved by:  Nothing Exacerbated by: lying down. Ineffective treatments: inhaler, sitting up, walking around. Associated symptoms: shortness of breath and weakness   Associated symptoms: no abdominal pain, no cough, no dizziness, no fatigue, no headache, no lower extremity edema, no nausea, no palpitations and no vomiting   Risk factors: male sex and obesity   Risk factors: no immobilization, no smoking and no surgery   Risk factors comment:  Hx of asthma, portal vein thrombosis and family hx of clots but no longer on coumdain for several years.  no family hx of CAD      Past Medical History:  Diagnosis Date  . 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 Abdominal pain 05/14/2013  . 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 No pertinent surgical history.     Family History  Problem Relation Age of Onset  . Diabetes Mother   . Diabetes Father   . Hypertension Father  Social History   Tobacco Use  . Smoking status: Never Smoker  . Smokeless tobacco: Never Used  Substance Use Topics  . Alcohol use: No  . Drug use: No    Home Medications Prior to Admission medications   Medication Sig Start Date End Date Taking? Authorizing Provider  amoxicillin (AMOXIL) 875 MG tablet Take 1 tablet (875 mg total) by mouth 2 (two) times daily. 11/16/19   Renne Crigler, PA-C  Capsicum, Cayenne, (CAYENNE PEPPER PO) Take 1 capsule by mouth daily.    [provider]  GNP GARLIC EXTRACT PO Take 1 capsule by mouth daily.    [provider]  Omega-3 Fatty Acids (OMEGA 3 PO) Take 1,600 mg by mouth  daily. Takes 1600mg  daily. 1Tablespoon = 1600mg .    [provider]  TURMERIC PO Take 1 capsule by mouth daily.    [provider]  Rivaroxaban (XARELTO) 20 MG TABS tablet Take 1 tablet (20 mg total) by mouth daily with supper. 06/02/13 11/23/19  07/31/13, MD    Allergies    Patient has no known allergies.  Review of Systems   Review of Systems  Constitutional: Negative for fatigue.  Respiratory: Positive for shortness of breath. Negative for cough.   Cardiovascular: Positive for chest pain. Negative for palpitations.  Gastrointestinal: Negative for abdominal pain, nausea and vomiting.  Neurological: Positive for weakness. Negative for dizziness and headaches.  All other systems reviewed and are negative.   Physical Exam Updated Vital Signs BP (!) 138/94 (BP Location: Right Arm)   Pulse 64   Temp 98.6 F (37 C) (Oral)   Resp 18   Ht 6' (1.829 m)   Wt (!) 158.8 kg   SpO2 100%   BMI 47.47 kg/m   Physical Exam Vitals and nursing note reviewed.  Constitutional:      General: He is not in acute distress.    Appearance: Normal appearance. He is well-developed and well-nourished.  HENT:     Head: Normocephalic and atraumatic.     Mouth/Throat:     Mouth: Oropharynx is clear and moist.  Eyes:     Extraocular Movements: EOM normal.     Conjunctiva/sclera: Conjunctivae normal.     Pupils: Pupils are equal, round, and reactive to light.  Cardiovascular:     Rate and Rhythm: Normal rate and regular rhythm.     Pulses: Normal pulses and intact distal pulses.     Heart sounds: No murmur heard.   Pulmonary:     Effort: Pulmonary effort is normal. No respiratory distress.     Breath sounds: Normal breath sounds. No wheezing or rales.  Chest:     Chest wall: No tenderness.  Abdominal:     General: There is no distension.     Palpations: Abdomen is soft.     Tenderness: There is no abdominal tenderness. There is no guarding or rebound.  Musculoskeletal:         General: No tenderness or edema. Normal range of motion.     Cervical back: Normal range of motion and neck supple.     Right lower leg: No edema.     Left lower leg: No edema.     Comments: No calf tenderness  Skin:    General: Skin is warm and dry.     Findings: No erythema or rash.  Neurological:     General: No focal deficit present.     Mental Status: He is alert and oriented to person, place, and  time. Mental status is at baseline.  Psychiatric:        Mood and Affect: Mood and affect and mood normal.        Behavior: Behavior normal.        Thought Content: Thought content normal.     ED Results / Procedures / Treatments   Labs (all labs ordered are listed, but only abnormal results are displayed) Labs Reviewed  BASIC METABOLIC PANEL - Abnormal; Notable for the following components:      Result Value   Glucose, Bld 106 (*)    Calcium 8.8 (*)    All other components within normal limits  TROPONIN I (HIGH SENSITIVITY) - Abnormal; Notable for the following components:   Troponin I (High Sensitivity) 19 (*)    All other components within normal limits  TROPONIN I (HIGH SENSITIVITY) - Abnormal; Notable for the following components:   Troponin I (High Sensitivity) 18 (*)    All other components within normal limits  CBC  D-DIMER, QUANTITATIVE (NOT AT [facility])    EKG EKG Interpretation  Date/Time:  Tuesday June 15 2020 23:37:03 EST Ventricular Rate:  79 PR Interval:  140 QRS Duration: 98 QT Interval:  402 QTC Calculation: 460 R Axis:   -12 Text Interpretation: Normal sinus rhythm Incomplete right bundle branch block Minimal voltage criteria for LVH, may be normal variant ( R in aVL ) Possible Anterior infarct , age undetermined Prolonged QT RESOLVED SINCE PREVIOUS Otherwise no significant change Confirmed by Gwyneth Sprout ([id number]) on 06/16/2020 8:38:06 AM   Radiology DG Chest 2 View  Result Date: 06/16/2020 CLINICAL DATA:  Chest pain EXAM: CHEST - 2 VIEW  COMPARISON:  11/23/2019 FINDINGS: The heart size and mediastinal contours are within normal limits. Both lungs are clear. The visualized skeletal structures are unremarkable. IMPRESSION: No active cardiopulmonary disease. Electronically Signed   By: Helyn Numbers MD   On: 06/16/2020 00:11    Procedures Procedures   Medications Ordered in ED Medications - No data to display  ED Course  I have reviewed the triage vital signs and the nursing notes.  Pertinent labs & imaging results that were available during my care of the patient were reviewed by me and considered in my medical decision making (see chart for details).    MDM Rules/Calculators/A&P                          Patient is presenting today with nonspecific chest pain history.  Now the symptoms of a most completely resolved and are only present if he is lying down and he rates it at a 2 out of 10.  While standing and moving around it is now 0.  Symptoms occurred when he laid down last night to go to sleep.  He has had a similar episode once in July 2021.  Based on reported history it sounds identical to today's episode except he reports today was a little more severe.  His most significant concern was it felt like he could not catch his breath.  He denies any Covid-like illness and is well-appearing.  He is satting 100% on room air and has no evidence of tachypnea.  He initially was hypertensive upon arrival but that has improved.  Heart rate has been normal without evidence of dysrhythmia.  Patient's EKG does show an incomplete right bundle branch block and LVH pattern but no acute changes today.  Chest x-ray is normal.  Low suspicion for ACS given  patient story, unchanged EKG and flat troponins at 18 and 19.  When patient was seen in July troponins were in the 20s and at that time he had an echo done which showed normal EF and no wall movement abnormalities.  Given patient's family history of clots, his prior portal thrombosis we'll check  a D-dimer to ensure further evaluation does not need to be done for PE.  However doubt pericarditis, myocarditis or pericardial effusion at this time.  Could be related to his asthma versus GERD.  1:28 PM D-dimer is neg.  Pt d/ced home with cardiology f/u if symptoms continue.  Also encouraged trying his inhaler as needed and watching his diet.  MDM Number of Diagnoses or Management Options   Amount and/or Complexity of Data Reviewed Clinical lab tests: ordered and reviewed Tests in the radiology section of CPT: ordered and reviewed Tests in the medicine section of CPT: reviewed and ordered Decide to obtain previous medical records or to obtain history from someone other than the patient: yes Obtain history from someone other than the patient: yes Review and summarize past medical records: yes Discuss the patient with other providers: no Independent visualization of images, tracings, or specimens: yes  Risk of Complications, Morbidity, and/or Mortality Presenting problems: moderate Diagnostic procedures: low Management options: minimal  Patient Progress Patient progress: improved   Final Clinical Impression(s) / ED Diagnoses Final diagnoses:  Nonspecific chest pain    Rx / DC Orders ED Discharge Orders    None       Gwyneth Sprout, MD 06/16/20 1329

## 2020-07-11 DIAGNOSIS — R072 Precordial pain: Secondary | ICD-10-CM | POA: Insufficient documentation

## 2020-07-11 NOTE — Progress Notes (Signed)
Cardiology Office Note   Date:  07/13/2020   ID:  Vincent Mckinney, DOB 14-Oct-1985, MRN [id number]  PCP:  Patient, No Pcp Per  Cardiologist:   Rollene Rotunda, MD Referring:  Self  No chief complaint on file.     History of Present Illness: Vincent Mckinney is a 35 y.o. male who is referred by himself for evaluation of chest pain.    The patient has had a couple of ER visits for this.  I did review records from April of last year, we also saw him in July in consultation and January of this year he was in the ER and I reviewed these records for this visit.  He says he has been having chest discomfort probably about 4 times over 3 years.  It is a squeezing sensation.  It seems to happen at night.  It is mid chest and upper epigastric area.  He might get a little short of breath with this.  Is not like his previous asthma discomfort.  It can be 6 out of 10 in intensity.  It happens when he is lying down when he gets up and walks around.  It will go away.  In the emergency room he had a very flat minimally elevated troponin but no acute EKG changes.  This was felt to be nonanginal.  He has not had any discomfort since then.  He has started exercising doing the stair climber another aerobic activities at the gym.  He feels great with this and is not able to get any discomfort.  He denies any new shortness of breath, PND or orthopnea.  He has had no swelling.  He has not had any prior cardiac testing.  He did have a report of hepatic vein thrombosis.  I did go back to 2015 as he did not remember seeing anybody in hematology oncology but he did see them.  He actually had a hypercoagulable work-up and was not thought to require further therapy or have a hypercoagulable condition.  Of note when we saw him previously we did suggest that he have a treadmill test but he actually declined to schedule this.  Past Medical History:  Diagnosis Date  . Asthma   . Hepatic vein thrombosis (HCC)     No past  surgical history on file.   Current Outpatient Medications  Medication Sig Dispense Refill  . amoxicillin (AMOXIL) 875 MG tablet Take 1 tablet (875 mg total) by mouth 2 (two) times daily. 14 tablet 0  . calcium-vitamin D (OSCAL WITH D) 500-200 MG-UNIT tablet Take 1 tablet by mouth.    . Capsicum, Cayenne, (CAYENNE PEPPER PO) Take 1 capsule by mouth daily.    Marland Kitchen GNP GARLIC EXTRACT PO Take 1 capsule by mouth daily.    Marland Kitchen MILK THISTLE-DAND-FENNEL-LICOR PO Take by mouth.    . Omega-3 Fatty Acids (OMEGA 3 PO) Take 1,600 mg by mouth daily. Takes 1600mg  daily. 1Tablespoon = 1600mg .    . TURMERIC PO Take 1 capsule by mouth daily.     No current facility-administered medications for this visit.    Allergies:   Patient has no known allergies.    Social History:  The patient  reports that he has never smoked. He has never used smokeless tobacco. He reports that he does not drink alcohol and does not use drugs.   Family History:  The patient's family history includes Diabetes in his father and mother; Hypertension in his father; Pulmonary embolism in his maternal uncle.  ROS:  Please see the history of present illness.   Otherwise, review of systems are positive for none.   All other systems are reviewed and negative.    PHYSICAL EXAM: VS:  BP (!) 134/100   Pulse 74   Ht 6' (1.829 m)   Wt (!) 370 lb (167.8 kg)   BMI 50.18 kg/m  , BMI Body mass index is 50.18 kg/m. GENERAL:  Well appearing HEENT:  Pupils equal round and reactive, fundi not visualized, oral mucosa unremarkable NECK:  No jugular venous distention, waveform within normal limits, carotid upstroke brisk and symmetric, no bruits, no thyromegaly LYMPHATICS:  No cervical, inguinal adenopathy LUNGS:  Clear to auscultation bilaterally BACK:  No CVA tenderness CHEST:  Unremarkable HEART:  PMI not displaced or sustained,S1 and S2 within normal limits, no S3, no S4, no clicks, no rubs, no murmurs ABD:  Flat, positive bowel sounds  normal in frequency in pitch, no bruits, no rebound, no guarding, no midline pulsatile mass, no hepatomegaly, no splenomegaly EXT:  2 plus pulses throughout, no edema, no cyanosis no clubbing SKIN:  No rashes no nodules NEURO:  Cranial nerves II through XII grossly intact, motor grossly intact throughout PSYCH:  Cognitively intact, oriented to person place and time    EKG:  EKG is ordered today. The ekg ordered today demonstrates sinus rhythm, rate 74, axis within normal limits, intervals within normal limits, premature atrial contractions, no acute ST-T wave changes.   Recent Labs: 06/15/2020: BUN 8; Creatinine, Ser 1.11; Hemoglobin 13.5; Platelets 237; Potassium 3.9; Sodium 135    Lipid Panel No results found for: CHOL, TRIG, HDL, CHOLHDL, VLDL, LDLCALC, LDLDIRECT    Wt Readings from Last 3 Encounters:  07/13/20 (!) 370 lb (167.8 kg)  06/15/20 (!) 350 lb (158.8 kg)  11/23/19 (!) 350 lb 1.5 oz (158.8 kg)      Other studies Reviewed: Additional studies/ records that were reviewed today include: ED records. Review of the above records demonstrates:  Please see elsewhere in the note.  ( (Greater than 40 minutes reviewing all data with greater than 50% face to face with the patient).   ASSESSMENT AND PLAN:  CHEST PAIN:    The patient's chest pain is atypical.  It is now gone away completely.  He is exercising routinely.  He has minimal cardiovascular risk factors.  At this point I do not think further cardiovascular testing is suggested.  HTN: His blood pressure is elevated and it has been consistently.  I gave him a diagnosis of hypertension we talked about this at length.  I do not think he needs medication at this point but should get a blood pressure cuff and follow his own blood pressures.  I think he would bring his pressure down with therapeutic lifestyle changes to include weight loss, exercise and salt restriction.  HEPATIC VEIN THROMBOSIS: We had a long discussion about  this and after he left the office I was able to go back and review extensive records including a hypercoagulable work-up that was negative per heme-onc.  He was treated at that time and discharged from heme-onc.  I would not suggest further work-up.  OBESITY: We talked at length about diet and weight loss strategies.  We talked about exercise.   Current medicines are reviewed at length with the patient today.  The patient does not have concerns regarding medicines.  The following changes have been made:  no change  Labs/ tests ordered today include:   Orders Placed This Encounter  Procedures  . Hypercoagulable panel, comprehensive  . EKG 12-Lead     Disposition:   FU with me as needed   Signed, Rollene Rotunda, MD  07/13/2020 5:23 PM    [facility]

## 2020-07-13 ENCOUNTER — Ambulatory Visit (INDEPENDENT_AMBULATORY_CARE_PROVIDER_SITE_OTHER): Payer: Self-pay | Admitting: Cardiology

## 2020-07-13 ENCOUNTER — Encounter: Payer: Self-pay | Admitting: Cardiology

## 2020-07-13 ENCOUNTER — Other Ambulatory Visit: Payer: Self-pay

## 2020-07-13 VITALS — BP 134/100 | HR 74 | Ht 72.0 in | Wt 370.0 lb

## 2020-07-13 DIAGNOSIS — R072 Precordial pain: Secondary | ICD-10-CM

## 2020-07-13 DIAGNOSIS — I820 Budd-Chiari syndrome: Secondary | ICD-10-CM

## 2020-07-13 NOTE — Patient Instructions (Signed)
Medication Instructions:  No changes *If you need a refill on your cardiac medications before your next appointment, please call your pharmacy*  Lab Work: Your physician recommends that you return for lab work: Hypercoagulable panel If you have labs (blood work) drawn today and your tests are completely normal, you will receive your results only by: Marland Kitchen MyChart Message (if you have MyChart) OR . A paper copy in the mail If you have any lab test that is abnormal or we need to change your treatment, we will call you to review the results.  Follow-Up: At [facility], you and your health needs are our priority.  As part of our continuing mission to provide you with exceptional heart care, we have created designated Provider Care Teams.  These Care Teams include your primary Cardiologist (physician) and Advanced Practice Providers (APPs -  Physician Assistants and Nurse Practitioners) who all work together to provide you with the care you need, when you need it.  Your next appointment:   Follow up as needed

## 2020-08-09 LAB — HYPERCOAGULABLE PANEL, COMPREHENSIVE
APTT: 28.7 s
AT III Act/Nor PPP Chro: 97 %
Act. Prt C Resist w/FV Defic.: 2.5 ratio
Anticardiolipin Ab, IgG: <10 [GPL'U]
Anticardiolipin Ab, IgM: <10 [MPL'U]
Beta-2 Glycoprotein I, IgA: <10 SAU
Beta-2 Glycoprotein I, IgG: <10 SGU
Beta-2 Glycoprotein I, IgM: <10 SMU
DRVVT Screen Seconds: 40.1 s
Factor VII Antigen**: 115 %
Factor VIII Activity: 148 %
Hexagonal Phospholipid Neutral: 0 s
Homocysteine: 10.1 umol/L
Prot C Ag Act/Nor PPP Imm: 102 %
Prot S Ag Act/Nor PPP Imm: 93 %
Protein C Ag/FVII Ag Ratio**: 0.9 ratio
Protein S Ag/FVII Ag Ratio**: 0.8 ratio

## 2020-09-10 ENCOUNTER — Encounter (HOSPITAL_COMMUNITY): Payer: Self-pay | Admitting: *Deleted

## 2020-09-10 ENCOUNTER — Other Ambulatory Visit: Payer: Self-pay

## 2020-09-10 ENCOUNTER — Emergency Department (HOSPITAL_COMMUNITY): Payer: 59

## 2020-09-10 ENCOUNTER — Emergency Department (HOSPITAL_COMMUNITY)
Admission: EM | Admit: 2020-09-10 | Discharge: 2020-09-10 | Disposition: A | Discharge status: Home / Self Care | Payer: 59 | Attending: Emergency Medicine | Admitting: Emergency Medicine

## 2020-09-10 DIAGNOSIS — Z79899 Other long term (current) drug therapy: Secondary | ICD-10-CM | POA: Insufficient documentation

## 2020-09-10 DIAGNOSIS — J45909 Unspecified asthma, uncomplicated: Secondary | ICD-10-CM | POA: Insufficient documentation

## 2020-09-10 DIAGNOSIS — I10 Essential (primary) hypertension: Secondary | ICD-10-CM | POA: Insufficient documentation

## 2020-09-10 LAB — BASIC METABOLIC PANEL
Anion gap: 10 (ref 5–15)
BUN: 11 mg/dL (ref 6–20)
CO2: 25 mmol/L (ref 22–32)
Calcium: 9.6 mg/dL (ref 8.9–10.3)
Chloride: 100 mmol/L (ref 98–111)
Creatinine, Ser: 1.11 mg/dL (ref 0.61–1.24)
GFR, Estimated: >60 mL/min (ref >60)
Glucose, Bld: 84 mg/dL (ref 70–99)
Potassium: 4.3 mmol/L (ref 3.5–5.1)
Sodium: 135 mmol/L (ref 135–145)

## 2020-09-10 MED ORDER — AMLODIPINE BESYLATE 5 MG PO TABS
5.0000 mg | ORAL_TABLET | Freq: Once | ORAL | Status: AC
  Administered 2020-09-10: 5 mg via ORAL
  Filled 2020-09-10: qty 1

## 2020-09-10 MED ORDER — AMLODIPINE BESYLATE 5 MG PO TABS
5.0000 mg | ORAL_TABLET | Freq: Every day | ORAL | 0 refills | Status: AC
  Filled 2020-09-10: qty 30, 30d supply, fill #0

## 2020-09-10 NOTE — ED Triage Notes (Signed)
Pt here for high bp  He has been taking his bp frequently    The ucc has sent him here

## 2020-09-10 NOTE — ED Triage Notes (Signed)
Emergency Medicine Provider Triage Evaluation Note  Vincent Mckinney , a 35 y.o. male  was evaluated in triage.  Pt complains of high blood pressure.  Patient notes that he checks his blood pressure at home and noted to be 150/110.  Patient went to the urgent care after this and states he was told to come to the emergency department because she would need to be started on a "drip," to lower his blood pressure.  Patient reports he has been told in the past he has high blood pressure however is not on any medications.  Patient endorses shortness of breath.  States that it has been present for the last 2 weeks.  Patient states she has a history of asthma and does like allergies have exacerbated his asthma.  Patient denies any headaches, facial asymmetry, slurred speech, focal neurological deficit, chest pain, headache, vomiting numbness or tingling to extremities.   Review of Systems  Positive: shob Negative: headaches, facial asymmetry, slurred speech, focal neurological deficit, chest pain, headache, vomiting numbness or tingling to extremities.  Physical Exam  BP (!) 148/106 (BP Location: Right Arm)   Pulse 81   Temp 98.6 F (37 C) (Oral)   Resp 16   Ht 6' (1.829 m)   Wt (!) 167.8 kg   SpO2 97%   BMI 50.18 kg/m  Gen:   Awake, no distress   HEENT:  Atraumatic  Resp:  Normal effort, able to speak in full complete sentences without difficulty.  Lungs clear to auscultation bilaterally Cardiac:  Normal rate  MSK:   Moves extremities without difficulty  Neuro:  Speech clear   Medical Decision Making  Medically screening exam initiated at 5:07 PM.  Appropriate orders placed.  Vincent Mckinney was informed that the remainder of the evaluation will be completed by another provider, this initial triage assessment does not replace that evaluation, and the importance of remaining in the ED until their evaluation is complete.  Clinical Impression   The patient appears stable so that the  remainder of the work up may be completed by another provider.      Haskel Schroeder, [place] 09/10/20 1710

## 2020-09-10 NOTE — ED Provider Notes (Signed)
[facility] EMERGENCY DEPARTMENT Provider Note   CSN: [id number] Arrival date & time: 09/10/20  1607     History Chief Complaint  Patient presents with  . Hypertension    Mamoudou Mulvehill is a 35 y.o. male.  He was sent here from an urgent care.  He has noted hypertension for quite some time at home.  He has had a cardiovascular work-up which was normal.  He has random and intermittent dyspnea but is able to complete 30-minute runs without problems.  He also has been experiencing diffuse body tingling especially at night.  He is attributing his symptoms to his high blood pressure.  The history is provided by the patient.  Hypertension This is a new problem. The current episode started more than 1 week ago. The problem occurs daily. The problem has not changed since onset.Associated symptoms include shortness of breath. Pertinent negatives include no chest pain, no abdominal pain and no headaches. Nothing aggravates the symptoms. Nothing relieves the symptoms. Treatments tried: Lifestyle changes including diet and exercise. The treatment provided no relief.       Past Medical History:  Diagnosis Date  . Asthma   . Hepatic vein thrombosis [facility])     Patient Active Problem List   Diagnosis Date Noted  . Precordial chest pain 07/11/2020  . 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 Abdominal pain 05/14/2013  . 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 No pertinent surgical history.     Family History  Problem Relation Age of Onset  . Diabetes Mother   . Diabetes Father   . Hypertension Father   . Pulmonary embolism Maternal Uncle        In his 69s    Social History   Tobacco Use  . Smoking status: Never Smoker  . Smokeless tobacco: Never Used  Substance Use Topics  . Alcohol use: No  . Drug use: No    Home Medications Prior to Admission medications   Medication Sig Start Date End Date Taking? Authorizing Provider   amLODipine (NORVASC) 5 MG tablet Take 1 tablet (5 mg total) by mouth daily. 09/10/20  Yes Koleen Distance, MD  calcium-vitamin D (OSCAL WITH D) 500-200 MG-UNIT tablet Take 1 tablet by mouth.    [provider]  Capsicum, Cayenne, (CAYENNE PEPPER PO) Take 1 capsule by mouth daily.    [provider]  GNP GARLIC EXTRACT PO Take 1 capsule by mouth daily.    [provider]  MILK THISTLE-DAND-FENNEL-LICOR PO Take by mouth.    [provider]  Omega-3 Fatty Acids (OMEGA 3 PO) Take 1,600 mg by mouth daily. Takes 1600mg  daily. 1Tablespoon = 1600mg .    [provider]  TURMERIC PO Take 1 capsule by mouth daily.    [provider]  Rivaroxaban (XARELTO) 20 MG TABS tablet Take 1 tablet (20 mg total) by mouth daily with supper. 06/02/13 11/23/19  07/31/13, MD    Allergies    Patient has no known allergies.  Review of Systems   Review of Systems  Constitutional: Negative for chills and fever.  HENT: Negative for ear pain and sore throat.   Eyes: Negative for pain and visual disturbance.  Respiratory: Positive for shortness of breath. Negative for cough.   Cardiovascular: Negative for chest pain and palpitations.  Gastrointestinal: Negative for abdominal pain and vomiting.  Genitourinary: Negative for dysuria and hematuria.  Musculoskeletal: Negative for arthralgias and back pain.  Skin: Negative for color change  and rash.  Neurological: Negative for seizures, syncope and headaches.       Tingling  All other systems reviewed and are negative.   Physical Exam Updated Vital Signs BP (!) 157/113   Pulse 83   Temp 98.6 F (37 C) (Oral)   Resp (!) 22   Ht 6' (1.829 m)   Wt (!) 167.8 kg   SpO2 100%   BMI 50.18 kg/m   Physical Exam Vitals and nursing note reviewed.  Constitutional:      Appearance: He is well-developed.  HENT:     Head: Normocephalic and atraumatic.  Eyes:     Conjunctiva/sclera: Conjunctivae normal.  Cardiovascular:      Rate and Rhythm: Normal rate and regular rhythm.     Heart sounds: No murmur heard.   Pulmonary:     Effort: Pulmonary effort is normal. No respiratory distress.     Breath sounds: Normal breath sounds.  Abdominal:     Palpations: Abdomen is soft.     Tenderness: There is no abdominal tenderness.  Musculoskeletal:     Cervical back: Neck supple.  Skin:    General: Skin is warm and dry.  Neurological:     Mental Status: He is alert.     ED Results / Procedures / Treatments   Labs (all labs ordered are listed, but only abnormal results are displayed) Labs Reviewed  BASIC METABOLIC PANEL    EKG None  Radiology DG Chest 2 View  Result Date: 09/10/2020 CLINICAL DATA:  Shortness of breath and hypertension. EXAM: CHEST - 2 VIEW COMPARISON:  06/16/2020 FINDINGS: The heart size and mediastinal contours are within normal limits. Both lungs are clear. The visualized skeletal structures are unremarkable. IMPRESSION: No active cardiopulmonary disease. Electronically Signed   By: Signa Kell M.D.   On: 09/10/2020 17:34    Procedures Procedures   Medications Ordered in ED Medications  amLODipine (NORVASC) tablet 5 mg (has no administration in time range)    ED Course  I have reviewed the triage vital signs and the nursing notes.  Pertinent labs & imaging results that were available during my care of the patient were reviewed by me and considered in my medical decision making (see chart for details).    MDM Rules/Calculators/A&P                          Orlen Leedy is well-appearing.  He does have hypertension, and based on his history, this seems to be a quite chronic problem.  He is implementing lifestyle changes, but he is still hypertensive.  I would like to start him on Norvasc.  He states that he has a clinic to follow-up with.  No evidence of a hypertensive emergency at this point.  He will be discharged home. Final Clinical Impression(s) / ED  Diagnoses Final diagnoses:  Primary hypertension    Rx / DC Orders ED Discharge Orders         Ordered    amLODipine (NORVASC) 5 MG tablet  Daily        09/10/20 2146           Koleen Distance, MD 09/10/20 2150

## 2020-09-11 ENCOUNTER — Other Ambulatory Visit (HOSPITAL_COMMUNITY): Payer: Self-pay

## 2020-09-12 ENCOUNTER — Encounter (HOSPITAL_COMMUNITY): Payer: Self-pay | Admitting: *Deleted

## 2020-09-12 ENCOUNTER — Other Ambulatory Visit: Payer: Self-pay

## 2020-09-12 ENCOUNTER — Emergency Department (HOSPITAL_COMMUNITY)
Admission: EM | Admit: 2020-09-12 | Discharge: 2020-09-12 | Disposition: A | Discharge status: Home / Self Care | Payer: 59 | Attending: Emergency Medicine | Admitting: Emergency Medicine

## 2020-09-12 DIAGNOSIS — Z20822 Contact with and (suspected) exposure to covid-19: Secondary | ICD-10-CM | POA: Insufficient documentation

## 2020-09-12 DIAGNOSIS — I10 Essential (primary) hypertension: Secondary | ICD-10-CM | POA: Diagnosis not present

## 2020-09-12 DIAGNOSIS — Z7901 Long term (current) use of anticoagulants: Secondary | ICD-10-CM | POA: Insufficient documentation

## 2020-09-12 DIAGNOSIS — R030 Elevated blood-pressure reading, without diagnosis of hypertension: Secondary | ICD-10-CM | POA: Diagnosis present

## 2020-09-12 DIAGNOSIS — J45909 Unspecified asthma, uncomplicated: Secondary | ICD-10-CM | POA: Diagnosis not present

## 2020-09-12 LAB — CBC WITH DIFFERENTIAL/PLATELET
Abs Immature Granulocytes: 0.02 10*3/uL (ref 0.00–0.07)
Basophils Absolute: 0 10*3/uL (ref 0.0–0.1)
Basophils Relative: 0 %
Eosinophils Absolute: 0.4 10*3/uL (ref 0.0–0.5)
Eosinophils Relative: 6 %
HCT: 43.2 % (ref 39.0–52.0)
Hemoglobin: 14.3 g/dL (ref 13.0–17.0)
Immature Granulocytes: 0 %
Lymphocytes Relative: 47 %
Lymphs Abs: 3.4 10*3/uL (ref 0.7–4.0)
MCH: 29.6 pg (ref 26.0–34.0)
MCHC: 33.1 g/dL (ref 30.0–36.0)
MCV: 89.4 fL (ref 80.0–100.0)
Monocytes Absolute: 0.5 10*3/uL (ref 0.1–1.0)
Monocytes Relative: 7 %
Neutro Abs: 2.9 10*3/uL (ref 1.7–7.7)
Neutrophils Relative %: 40 %
Platelets: 223 10*3/uL (ref 150–400)
RBC: 4.83 MIL/uL (ref 4.22–5.81)
RDW: 14.2 % (ref 11.5–15.5)
WBC: 7.3 10*3/uL (ref 4.0–10.5)
nRBC: 0 % (ref 0.0–0.2)

## 2020-09-12 LAB — COMPREHENSIVE METABOLIC PANEL
ALT: 25 U/L (ref 0–44)
AST: 29 U/L (ref 15–41)
Albumin: 4 g/dL (ref 3.5–5.0)
Alkaline Phosphatase: 78 U/L (ref 38–126)
Anion gap: 7 (ref 5–15)
BUN: 15 mg/dL (ref 6–20)
CO2: 27 mmol/L (ref 22–32)
Calcium: 9.6 mg/dL (ref 8.9–10.3)
Chloride: 100 mmol/L (ref 98–111)
Creatinine, Ser: 1.22 mg/dL (ref 0.61–1.24)
GFR, Estimated: >60 mL/min (ref >60)
Glucose, Bld: 94 mg/dL (ref 70–99)
Potassium: 4.2 mmol/L (ref 3.5–5.1)
Sodium: 134 mmol/L — ABNORMAL LOW (ref 135–145)
Total Bilirubin: 0.5 mg/dL (ref 0.3–1.2)
Total Protein: 7.5 g/dL (ref 6.5–8.1)

## 2020-09-12 LAB — RESP PANEL BY RT-PCR (FLU A&B, COVID) ARPGX2
Influenza A by PCR: NEGATIVE
Influenza B by PCR: NEGATIVE
SARS Coronavirus 2 by RT PCR: NEGATIVE

## 2020-09-12 LAB — URINALYSIS, ROUTINE W REFLEX MICROSCOPIC
Bilirubin Urine: NEGATIVE
Glucose, UA: NEGATIVE mg/dL
Hgb urine dipstick: NEGATIVE
Ketones, ur: 20 mg/dL — AB
Leukocytes,Ua: NEGATIVE
Nitrite: NEGATIVE
Protein, ur: NEGATIVE mg/dL
Specific Gravity, Urine: 1.019 (ref 1.005–1.030)
pH: 5 (ref 5.0–8.0)

## 2020-09-12 MED ORDER — AMLODIPINE BESYLATE 5 MG PO TABS
5.0000 mg | ORAL_TABLET | Freq: Once | ORAL | Status: AC
  Administered 2020-09-12: 5 mg via ORAL
  Filled 2020-09-12: qty 1

## 2020-09-12 NOTE — Discharge Instructions (Addendum)
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  Please continue taking your blood pressure medicine as prescribed.  Please follow-up with your doctor.

## 2020-09-12 NOTE — ED Provider Notes (Signed)
[facility] EMERGENCY DEPARTMENT Provider Note   CSN: [id number] Arrival date & time: 09/12/20  0630     History Chief Complaint  Patient presents with  . Hypertension    Vincent Mckinney is a 35 y.o. male.  Patient presents to the emergency department with a chief complaint of elevated blood pressure.  He states that he was seen recently and prescribed amlodipine.  He reports he has been taking it as directed.  He states that he has follow-up with the primary care doctor tomorrow.  States that he has had persistently elevated blood pressures.  States that at times through the day he feels a numbness/body aches.  He denies chest pain or shortness of breath.  The history is provided by the patient. No language interpreter was used.       Past Medical History:  Diagnosis Date  . Asthma   . Hepatic vein thrombosis [facility])     Patient Active Problem List   Diagnosis Date Noted  . Precordial chest pain 07/11/2020  . 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 Abdominal pain 05/14/2013  . 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 No pertinent surgical history.     Family History  Problem Relation Age of Onset  . Diabetes Mother   . Diabetes Father   . Hypertension Father   . Pulmonary embolism Maternal Uncle        In his 64s    Social History   Tobacco Use  . Smoking status: Never Smoker  . Smokeless tobacco: Never Used  Substance Use Topics  . Alcohol use: No  . Drug use: No    Home Medications Prior to Admission medications   Medication Sig Start Date End Date Taking? Authorizing Provider  amLODipine (NORVASC) 5 MG tablet Take 1 tablet (5 mg total) by mouth daily. 09/10/20   Koleen Distance, MD  calcium-vitamin D (OSCAL WITH D) 500-200 MG-UNIT tablet Take 1 tablet by mouth.    [provider]  Capsicum, Cayenne, (CAYENNE PEPPER PO) Take 1 capsule by mouth daily.    [provider]  GNP GARLIC EXTRACT PO Take  1 capsule by mouth daily.    [provider]  MILK THISTLE-DAND-FENNEL-LICOR PO Take by mouth.    [provider]  Omega-3 Fatty Acids (OMEGA 3 PO) Take 1,600 mg by mouth daily. Takes 1600mg  daily. 1Tablespoon = 1600mg .    [provider]  TURMERIC PO Take 1 capsule by mouth daily.    [provider]  Rivaroxaban (XARELTO) 20 MG TABS tablet Take 1 tablet (20 mg total) by mouth daily with supper. 06/02/13 11/23/19  07/31/13, MD    Allergies    Patient has no known allergies.  Review of Systems   Review of Systems  All other systems reviewed and are negative.   Physical Exam Updated Vital Signs BP 136/90   Pulse 65   Temp 98.6 F (37 C) (Oral)   Resp 16   Ht 5' (1.524 m)   Wt (!) 167.8 kg   SpO2 97%   BMI 72.25 kg/m   Physical Exam Vitals and nursing note reviewed.  Constitutional:      Appearance: He is well-developed.  HENT:     Head: Normocephalic and atraumatic.  Eyes:     Conjunctiva/sclera: Conjunctivae normal.  Cardiovascular:     Rate and Rhythm: Normal rate and regular rhythm.     Heart sounds: No murmur heard.   Pulmonary:  Effort: Pulmonary effort is normal. No respiratory distress.     Breath sounds: Normal breath sounds.  Abdominal:     Palpations: Abdomen is soft.     Tenderness: There is no abdominal tenderness.  Musculoskeletal:        General: Normal range of motion.     Cervical back: Neck supple.  Skin:    General: Skin is warm and dry.  Neurological:     Mental Status: He is alert and oriented to person, place, and time.  Psychiatric:        Mood and Affect: Mood normal.        Behavior: Behavior normal.     ED Results / Procedures / Treatments   Labs (all labs ordered are listed, but only abnormal results are displayed) Labs Reviewed  COMPREHENSIVE METABOLIC PANEL - Abnormal; Notable for the following components:      Result Value   Sodium 134 (*)    All other components within normal limits   URINALYSIS, ROUTINE W REFLEX MICROSCOPIC - Abnormal; Notable for the following components:   Ketones, ur 20 (*)    All other components within normal limits  RESP PANEL BY RT-PCR (FLU A&B, COVID) ARPGX2  CBC WITH DIFFERENTIAL/PLATELET    EKG None  Radiology DG Chest 2 View  Result Date: 09/10/2020 CLINICAL DATA:  Shortness of breath and hypertension. EXAM: CHEST - 2 VIEW COMPARISON:  06/16/2020 FINDINGS: The heart size and mediastinal contours are within normal limits. Both lungs are clear. The visualized skeletal structures are unremarkable. IMPRESSION: No active cardiopulmonary disease. Electronically Signed   By: Signa Kell M.D.   On: 09/10/2020 17:34    Procedures Procedures   Medications Ordered in ED Medications  amLODipine (NORVASC) tablet 5 mg (5 mg Oral Given 09/12/20 0434)    ED Course  I have reviewed the triage vital signs and the nursing notes.  Pertinent labs & imaging results that were available during my care of the patient were reviewed by me and considered in my medical decision making (see chart for details).    MDM Rules/Calculators/A&P                          Patient here with elevated blood pressure.  Recently prescribed amlodipine.  Still having some high blood pressures.  I encouraged him to continue taking his blood pressure medication as directed.  His laboratory work-up is reassuring here.  He has PCP follow-up tomorrow.  Patient seems quite anxious, this could be contributing to his symptoms.  I have low suspicion for ACS, PE, dissection.  His blood pressure has improved significantly with amlodipine in the ED.  DC home with outpatient follow up.  Vitals:   09/12/20 0445 09/12/20 0530  BP: 136/90 124/80  Pulse: 65 65  Resp: 16 16  Temp:    SpO2: 97% 98%    Final Clinical Impression(s) / ED Diagnoses Final diagnoses:  Hypertension, unspecified type    Rx / DC Orders ED Discharge Orders    None       Roxy Horseman,  PA-C 09/12/20 0535    Palumbo, April, MD 09/12/20 4332

## 2020-09-12 NOTE — ED Provider Notes (Signed)
MSE was initiated and I personally evaluated the patient and placed orders (if any) at  1:08 AM on September 12, 2020.  Patient here with persistently elevated BP.  Reports generalized body aches x 3-4 days.  Was just started on amlodipine.    PE: Gen: A&O x4 HEENT: PERRL, EOM intact ABD: BS x 4, ND/NT EXT: No edema, strong peripheral pulses  Ordered dose of norvasc.  Discussed with patient that their care has been initiated.   They are counseled that they will need to remain in the ED until the completion of their workup, including full H&P and results of any tests.  Risks of leaving the emergency department prior to completion of treatment were discussed. Patient was advised to inform ED staff if they are leaving before their treatment is complete. The patient acknowledged these risks and time was allowed for questions.    The patient appears stable so that the remainder of the MSE may be completed by another provider.    Roxy Horseman, PA-C 09/12/20 Laural Roes, April, MD 09/12/20 0127

## 2020-09-12 NOTE — ED Triage Notes (Signed)
The pt is c/o  High bp  He was just here in the past few days  He  Does not think that he is any better  Same complain for several visits to the hospital

## 2020-10-26 ENCOUNTER — Other Ambulatory Visit: Payer: Self-pay | Admitting: Urgent Care

## 2020-10-26 DIAGNOSIS — R1011 Right upper quadrant pain: Secondary | ICD-10-CM

## 2020-11-02 ENCOUNTER — Other Ambulatory Visit: Payer: 59

## 2020-11-12 ENCOUNTER — Other Ambulatory Visit: Payer: 59

## 2020-11-18 ENCOUNTER — Encounter: Payer: Self-pay | Admitting: Internal Medicine

## 2020-11-18 ENCOUNTER — Ambulatory Visit (INDEPENDENT_AMBULATORY_CARE_PROVIDER_SITE_OTHER): Payer: 59 | Admitting: Internal Medicine

## 2020-11-18 ENCOUNTER — Other Ambulatory Visit: Payer: Self-pay

## 2020-11-18 VITALS — BP 124/89 | HR 77 | Resp 18 | Ht 72.0 in | Wt 308.0 lb

## 2020-11-18 DIAGNOSIS — R5383 Other fatigue: Secondary | ICD-10-CM | POA: Insufficient documentation

## 2020-11-18 DIAGNOSIS — R768 Other specified abnormal immunological findings in serum: Secondary | ICD-10-CM | POA: Diagnosis not present

## 2020-11-18 DIAGNOSIS — M6289 Other specified disorders of muscle: Secondary | ICD-10-CM | POA: Diagnosis not present

## 2020-11-18 NOTE — Progress Notes (Signed)
 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 male here for evaluation of positive ANA and elevated inflammatory markers with fatigue and muscle pains.  Symptoms started next year with intermittent episodes of muscle soreness or stiffness with periods of numbness and paresthesias sensations particularly in his extremities.  He also felt increased levels of fatigue and had several episodes of heart palpitations for which he had multiple ED evaluation for chest pain with unremarkable workup.  Since he started he has desire to take a holistic approach with several diet modifications including a period of ketogenic dieting and intermittent fasting experiencing a significant weight loss of about 60 pounds compared to 4 months ago.  He reports trial of treatment with prednisone that made him feel substantially worse especially with heart palpitations.  He was started on amlodipine for his hypertension but stopped taking this and his blood pressure has improved with the weight loss.  He denies any symptoms of areas of hair loss, skin rashes, or lymphadenopathy.  He has had some fingertip discoloration with cold exposure with bluish color.  He has noticed mildly painful mouth sores on the anterior surface of the lip.  He has history of portal vein thrombosis with negative hypercoagulability workup in the past.    Labs reviewed 09/2020 Vit D 86 CRP 24.7 RF neg CCP neg ANA 1:80 cytoplasmic Lyme neg ESR 19 Uric acid 7.8   Activities of Daily Living:  Patient reports morning stiffness for 30 minutes.   Patient Denies nocturnal pain.  Difficulty dressing/grooming: Denies Difficulty climbing stairs: Denies Difficulty  getting out of chair: Denies Difficulty using hands for taps, buttons, cutlery, and/or writing: Denies  Review of Systems  Constitutional:  Positive for fatigue.  HENT:  Positive for mouth dryness. Negative for mouth sores and nose dryness.   Eyes:  Negative for pain, itching and dryness.  Respiratory:  Negative for shortness of breath and difficulty breathing.   Cardiovascular:  Positive for palpitations. Negative for chest pain.  Gastrointestinal:  Positive for constipation. Negative for blood in stool and diarrhea.  Endocrine: Negative for increased urination.  Genitourinary:  Negative for difficulty urinating.  Musculoskeletal:  Positive for morning stiffness. Negative for joint pain, joint pain, joint swelling, myalgias, muscle tenderness and myalgias.  Skin:  Negative for color change, rash and redness.  Allergic/Immunologic: Negative for susceptible to infections.  Neurological:  Positive for dizziness and numbness. Negative for headaches and memory loss.  Hematological:  Negative for bruising/bleeding tendency.  Psychiatric/Behavioral:  Negative for confusion.    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 No pertinent surgical history. Social History   Social History Narrative   Lives with wife and daughter.      There is no immunization history on file for this patient.   Objective: Vital Signs: BP 124/89 (BP Location: Right Arm, Patient Position:  Sitting, Cuff Size: Large)   Pulse 77   Resp 18   Ht 6' (1.829 m)   Wt (!) 308 lb (139.7 kg)   BMI 41.77 kg/m    Physical Exam Constitutional:      Appearance: He is obese.  HENT:     Right Ear: External ear normal.     Left Ear: External ear normal.     Mouth/Throat:     Mouth: Mucous membranes are moist.     Pharynx: Oropharynx is clear.  Eyes:     Conjunctiva/sclera: Conjunctivae normal.  Cardiovascular:     Rate and Rhythm: Normal rate and regular rhythm.  Pulmonary:     Effort: Pulmonary effort is normal.     Breath sounds: Normal breath sounds.  Skin:    General: Skin is warm and dry.     Findings: No rash.     Comments: Normal nailfold capillaroscopy  Neurological:     General: No focal deficit present.     Mental Status: He is alert.  Psychiatric:        Mood and Affect: Mood normal.     Musculoskeletal Exam:  Shoulders full ROM no tenderness or swelling Elbows full ROM no tenderness or swelling Wrists full ROM no tenderness or swelling Fingers full ROM no tenderness or swelling Knees full ROM no tenderness or swelling Ankles full ROM no tenderness or swelling    Investigation: No additional findings.  Imaging: No results found.  Recent Labs: Lab Results  Component Value Date   WBC 7.3 09/12/2020   HGB 14.3 09/12/2020   PLT 223 09/12/2020   NA 134 (L) 09/12/2020   K 4.2 09/12/2020   CL 100 09/12/2020   CO2 27 09/12/2020   GLUCOSE 94 09/12/2020   BUN 15 09/12/2020   CREATININE 1.22 09/12/2020   BILITOT 0.5 09/12/2020   ALKPHOS 78 09/12/2020   AST 29 09/12/2020   ALT 25 09/12/2020   PROT 7.5 09/12/2020   ALBUMIN 4.0 09/12/2020   CALCIUM 9.6 09/12/2020   GFRAA >60 11/23/2019    Speciality Comments: No specialty comments available.  Procedures:  No procedures performed Allergies: Prednisone   Assessment / Plan:     Visit Diagnoses: Positive ANA (antinuclear antibody) - Plan: RNP Antibody, Anti-Smith antibody, Sjogrens syndrome-A  extractable nuclear antibody, Sjogrens syndrome-B extractable nuclear antibody, Anti-DNA antibody, double-stranded  Positive ANA symptoms are somewhat generalized she does not demonstrate specific clinical criteria on physical exam today.  We will check more specific nuclear antibody tests today with lower pretest suspicion for an active autoimmune disease at this time.  Muscle stiffness - Plan: CK  No objective muscle weakness or deficit on exam but with diffuse muscular symptoms we will check CK.  If significantly abnormal would consider additional work-up to exclude inflammatory myopathy.  Other fatigue  Cause is not very clear could be related to some of the diet and lifestyle changes with his dramatic weight improvement recently.  Cardiac work-up so far not concerning for any problematic function or arrhythmia.  Orders: Orders Placed This Encounter  Procedures   RNP Antibody   Anti-Smith antibody   Sjogrens syndrome-A extractable nuclear   antibody   Sjogrens syndrome-B extractable nuclear antibody   Anti-DNA antibody, double-stranded   CK   No orders of the defined types were placed in this encounter.    Follow-Up Instructions: No follow-ups on file.   Collier Salina, MD  Note - This record has been created using [employer].  Chart creation errors have been sought, but may not always  have been located. Such creation errors do not reflect on  the standard of medical care.

## 2020-11-18 NOTE — Patient Instructions (Signed)
Creatine Kinase Test Why am I having this test? The creatine kinase (CK) test is done to check for damage to muscle tissue in the body. When muscles are damaged, they release the enzyme CK into thebloodstream. This test can be used to help diagnose a heart attack or diseases of theskeletal muscles, brain, or spinal cord. What is being tested? The creatine kinase test may measure the following: The total amount of CK in your blood (total CK). The amount of three different forms of CK (isoenzymes) in the blood: CK-MM, which is found in your skeletal muscles and heart. CK-MB, which is found mostly in your heart. CK-BB, which is found mostly in your brain. What kind of sample is taken?  At least one blood sample is required for this test. It is usually collected by inserting a needle into a blood vessel. In some cases, you may need to haveblood samples taken at regular intervals for up to 1 week. Tell a health care provider about: All medicines you are taking, including vitamins, herbs, eye drops, creams, and over-the-counter medicines. Any blood disorders you have. Any surgeries you have had. Any medical conditions you have. Whether you are pregnant or may be pregnant. How are the results reported? Your results will be reported as values that indicate: How much total CK is in your blood, given as units per liter (units/L). How much of each measured isoenzyme is in your blood, given as a percentage. Your health care provider will compare your results to normal ranges that were established after testing a large group of people (reference values). Reference values may vary among labs and hospitals. For total CK, common reference values are ranges that vary by age: Adult or elderly (values are higher after exercise): Male: 55-170 units/L. Male: 30-135 units/L. Newborn: 68-580 units/L. Reference values for each isoenzyme are: CK-MM: 100%. CK-MB: 0%. CK-BB: 0%. What do the results  mean? Results within reference ranges and values are normal. Levels of total CK that are higher than the reference ranges may mean that you have an injury or adisease affecting your heart, skeletal muscles, or brain. High levels of CK-MM may mean that you have: Certain conditions affecting the skeletal muscle. A variety of conditions can lead to breakdown of skeletal muscle (rhabdomyolysis). A recent history of surgery or injury. Conditions that cause convulsions. These are episodes of uncontrollable movement caused by sudden, intense tightening (contraction) of the muscles. High levels of CK-MB may mean that you have: A recent history of heart attack. Other conditions that cause injury to the heart muscle. High levels of CK-BB may be caused by: Taking certain psychiatric medicines. A disease that affects the brain and spinal cord (central nervous system). Certain types of cancer. Injury to the lungs. Talk with your health care provider about what your results mean. Questions to ask your health care provider Ask your health care provider, or the department that is doing the test: When will my results be ready? How will I get my results? What are my treatment options? What other tests do I need? What are my next steps? Summary The creatine kinase (CK) test is done to check for damage to muscle tissue in the body. This test can be used to help diagnose a heart attack or diseases of the skeletal muscles, brain, or spinal cord. This test involves measuring total CK and three different forms of CK in the blood. Talk to your health care provider about what your results may mean. This information is not  intended to replace advice given to you by your health care provider. Make sure you discuss any questions you have with your healthcare provider. Document Revised: 01/30/2020 Document Reviewed: 01/30/2020 Elsevier Patient Education  2022 [employer]  Anti-DNA Antibody Test Why am I having  this test? The anti-DNA antibody test helps with the diagnosis and follow-up of systemic lupus erythematosus (SLE). It is also used to monitor treatment of thiscondition as the antibody decreases with successful therapy. What is being tested? This test measures the amount of anti-DNA antibody in the blood. This antibody is found in 65-80% of patients with active SLE. This antibody is not as commonin patients who have other diseases. What kind of sample is taken?  A blood sample is required for this test. It is usually collected by insertinga needle into a blood vessel. How are the results reported? Your test results will be reported as a value. Your test results may also be reported as positive, intermediate, or negative. Your health care provider will compare your results to normal ranges that were established after testing a large group of people (reference values). Reference values may vary among labs and hospitals. For this test, common reference values are: Positive: 10 or more international units/mL. Intermediate: 5-9 international units/mL. Negative: Less than 5 international units/mL. What do the results mean? Positive results, which are associated with results that are higher than the reference values, may indicate: Autoimmune disorders such as SLE. Infectious mononucleosis. Chronic liver conditions. Intermediate results mean that the anti-DNA antibody levels are higher thannormal, but not high enough to be considered positive. Negative results mean that you do not have the anti-DNA antibody that isassociated with these conditions. Talk with your health care provider about what your results mean. Questions to ask your health care provider Ask your health care provider, or the department that is doing the test: When will my results be ready? How will I get my results? What are my treatment options? What other tests do I need? What are my next steps? Summary The anti-DNA antibody  test helps with the diagnosis and follow-up of systemic lupus erythematosus (SLE). It is also used to monitor treatment of this condition as the antibody decreases with successful therapy. This test measures the amount of anti-DNA antibody in the blood. Elevated levels of anti-DNA antibody can be seen in patients with SLE and certain other conditions.  Antinuclear Antibody Test Why am I having this test? This is a test that is used to help diagnose systemic lupus erythematosus (SLE) and other autoimmune diseases. An autoimmune disease is a disease in which the body's own defense (immune)system attacks its organs. What is being tested? This test checks for antinuclear antibodies (ANA) in the blood. The presence of ANA is associated with several autoimmune diseases. It is seen in almost allpatients with lupus. What kind of sample is taken?  A blood sample is required for this test. It is usually collected by insertinga needle into a blood vessel. How are the results reported? Your test results will be reported as either positive or negative. A false-positive result can occur. A false positive is incorrect because itmeans that a condition is present when it is not. What do the results mean? A positive test result may mean that you have: Lupus. Other autoimmune diseases, such as rheumatoid arthritis, scleroderma, or Sjgren syndrome. Conditions that may cause a false-positive result include: Liver dysfunction. Myasthenia gravis. Infectious mononucleosis. Talk with your health care provider about what your results mean. Questions to  ask your health care provider Ask your health care provider, or the department that is doing the test: When will my results be ready? How will I get my results? What are my treatment options? What other tests do I need? What are my next steps? Summary This is a test that is used to help diagnose systemic lupus erythematosus (SLE) and other autoimmune diseases.  An autoimmune disease is a disease in which the body's own defense (immune)system attacks the body. This test checks for antinuclear antibodies (ANA) in the blood. The presence of ANA is associated with several autoimmune diseases. It is seen in almost all patients with lupus. Your test results will be reported as either positive or negative. Talk with your health care provider about what your results mean. This information is not intended to replace advice given to you by your health care provider. Make sure you discuss any questions you have with your healthcare provider. Document Revised: 01/09/2020 Document Reviewed: 01/09/2020 Elsevier Patient Education  2022 [employer].

## 2020-11-19 LAB — RNP ANTIBODY: Ribonucleic Protein(ENA) Antibody, IgG: <1 AI

## 2020-11-19 LAB — SJOGRENS SYNDROME-B EXTRACTABLE NUCLEAR ANTIBODY: SSB (La) (ENA) Antibody, IgG: <1 AI

## 2020-11-19 LAB — ANTI-SMITH ANTIBODY: ENA SM Ab Ser-aCnc: <1 AI

## 2020-11-19 LAB — SJOGRENS SYNDROME-A EXTRACTABLE NUCLEAR ANTIBODY: SSA (Ro) (ENA) Antibody, IgG: <1 AI

## 2020-11-19 LAB — CK: Total CK: 101 U/L (ref 44–196)

## 2020-11-19 LAB — ANTI-DNA ANTIBODY, DOUBLE-STRANDED: ds DNA Ab: <1 IU/mL

## 2020-11-26 ENCOUNTER — Encounter: Payer: Self-pay | Admitting: Internal Medicine

## 2021-01-21 ENCOUNTER — Other Ambulatory Visit: Payer: Self-pay | Admitting: Urgent Care

## 2021-01-21 DIAGNOSIS — R1011 Right upper quadrant pain: Secondary | ICD-10-CM

## 2021-01-26 ENCOUNTER — Ambulatory Visit
Admission: RE | Admit: 2021-01-26 | Discharge: 2021-01-26 | Disposition: A | Discharge status: Home / Self Care | Payer: 59 | Source: Ambulatory Visit | Attending: Urgent Care | Admitting: Urgent Care

## 2021-01-26 DIAGNOSIS — R1011 Right upper quadrant pain: Secondary | ICD-10-CM

## 2021-07-06 ENCOUNTER — Emergency Department (HOSPITAL_COMMUNITY): Payer: BLUE CROSS/BLUE SHIELD

## 2021-07-06 ENCOUNTER — Emergency Department (HOSPITAL_COMMUNITY)
Admission: EM | Admit: 2021-07-06 | Discharge: 2021-07-07 | Disposition: A | Discharge status: Home / Self Care | Payer: BLUE CROSS/BLUE SHIELD | Attending: Emergency Medicine | Admitting: Emergency Medicine

## 2021-07-06 ENCOUNTER — Encounter (HOSPITAL_COMMUNITY): Payer: Self-pay

## 2021-07-06 ENCOUNTER — Other Ambulatory Visit: Payer: Self-pay

## 2021-07-06 DIAGNOSIS — L03011 Cellulitis of right finger: Secondary | ICD-10-CM | POA: Diagnosis not present

## 2021-07-06 DIAGNOSIS — Z7901 Long term (current) use of anticoagulants: Secondary | ICD-10-CM | POA: Diagnosis not present

## 2021-07-06 DIAGNOSIS — M79644 Pain in right finger(s): Secondary | ICD-10-CM | POA: Diagnosis present

## 2021-07-06 NOTE — ED Provider Triage Note (Signed)
Emergency Medicine Provider Triage Evaluation Note  Vincent Mckinney , a 36 y.o. male  was evaluated in triage.  Pt complains of right index finger laceration and infection.  States a few days ago he tried to get something out of it.  He feels like there is something retained in the finger.  Last tetanus 1 year ago.  No pain to flexor tendon sheath.  He has full range of motion, no numbness or weakness.  Review of Systems  Positive: Right index finger injury Negative: Numbness, weakness  Physical Exam  There were no vitals taken for this visit. Gen:   Awake, no distress   Resp:  Normal effort  MSK:   Moves extremities without difficulty,minimal close laceration to right distal index finger. Some increased fullness at proximal nail fold. No nailbed laceration, subungual hematoma. Non tender flexor tender. No fusiform swelling to finger Other:  Intact sensation  Medical Decision Making  Medically screening exam initiated at 3:45 PM.  Appropriate orders placed.  Nylen Creque was informed that the remainder of the evaluation will be completed by another provider, this initial triage assessment does not replace that evaluation, and the importance of remaining in the ED until their evaluation is complete.  Finger laceration x 3 days ago   Romualdo Prosise A, PA-C 07/06/21 1548

## 2021-07-06 NOTE — ED Triage Notes (Signed)
Pt arrived POV from home c/o right pointer finger pain and possible infection. Pt states he cut it open to drain some puss 2-3 days ago but it is not healing and is swollen.

## 2021-07-07 NOTE — ED Provider Notes (Signed)
[facility] EMERGENCY DEPARTMENT Provider Note   CSN: [id number] Arrival date & time: 07/06/21  1352     History  Chief Complaint  Patient presents with   Finger Injury    Vincent Mckinney is a 36 y.o. male.  HPI   36 y/o male presents for pain and swelling to the right index finger. He tried to drain a paronychia on his own at home and was not successful. He applied an antibacteria cream without resolution of symptoms. No fevers  Home Medications Prior to Admission medications   Medication Sig Start Date End Date Taking? Authorizing Provider  amLODipine (NORVASC) 5 MG tablet Take 1 tablet (5 mg total) by mouth daily. Patient not taking: Reported on 11/18/2020 09/10/20   Koleen Distance, MD  calcium-vitamin D (OSCAL WITH D) 500-200 MG-UNIT tablet Take 1 tablet by mouth.    [provider]  Capsicum, Cayenne, (CAYENNE PEPPER PO) Take 1 capsule by mouth daily. Patient not taking: Reported on 11/18/2020    [provider]  GNP GARLIC EXTRACT PO Take 1 capsule by mouth daily.    [provider]  MILK THISTLE-DAND-FENNEL-LICOR PO Take by mouth. Patient not taking: Reported on 11/18/2020    [provider]  Omega-3 Fatty Acids (OMEGA 3 PO) Take 1,600 mg by mouth daily. Takes 1600mg  daily. 1Tablespoon = 1600mg . Patient not taking: Reported on 11/18/2020    [provider]  OVER THE COUNTER MEDICATION daily. Myco    [provider]  TURMERIC PO Take 1 capsule by mouth daily.    [provider]  Rivaroxaban (XARELTO) 20 MG TABS tablet Take 1 tablet (20 mg total) by mouth daily with supper. 06/02/13 11/23/19  07/31/13, MD      Allergies    Prednisone    Review of Systems   Review of Systems See HPI for pertinent positives or negatives.   Physical Exam Updated Vital Signs BP (!) 145/103 (BP Location: Left Arm)    Pulse (!) 57    Temp 98 F (36.7 C) (Oral)    Resp 16    Ht 6' (1.829 m)    Wt 115.7 kg     SpO2 99%    BMI 34.58 kg/m  Physical Exam Constitutional:      General: He is not in acute distress.    Appearance: He is well-developed.  Eyes:     Conjunctiva/sclera: Conjunctivae normal.  Cardiovascular:     Rate and Rhythm: Normal rate.  Pulmonary:     Effort: Pulmonary effort is normal.  Skin:    General: Skin is warm and dry.     Comments: Pain swelling and redness to the distal right index finger consistent with paronychia  Neurological:     Mental Status: He is alert and oriented to person, place, and time.    ED Results / Procedures / Treatments   Labs (all labs ordered are listed, but only abnormal results are displayed) Labs Reviewed - No data to display  EKG None  Radiology DG Finger Index Right  Result Date: 07/06/2021 CLINICAL DATA:  Laceration EXAM: RIGHT INDEX FINGER 2+V COMPARISON:  None. FINDINGS: Diffuse soft tissue swelling. No radiopaque foreign body. No fracture or malalignment. No soft tissue emphysema IMPRESSION: Soft tissue swelling without acute osseous abnormality Electronically Signed   By: Artis Delay M.D.   On: 07/06/2021 16:31    Procedures .Jasmine PangIncision and Drainage  Date/Time: 07/07/2021 12:23 AM Performed by: Marland Kitchen, PA-C Authorized by: 07/09/2021,  Erisha Paugh S, PA-C   Consent:    Consent obtained:  Verbal   Consent given by:  Patient   Risks, benefits, and alternatives were discussed: yes     Risks discussed:  Bleeding, incomplete drainage and pain   Alternatives discussed:  No treatment Universal protocol:    Procedure explained and questions answered to patient or proxy's satisfaction: yes     Immediately prior to procedure, a time out was called: yes     Patient identity confirmed:  Verbally with patient Location:    Indications for incision and drainage: paronychia.   Size:  .5 cm   Location:  Upper extremity   Upper extremity location:  Finger   Finger location:  R index finger Pre-procedure details:    Skin  preparation:  Povidone-iodine Anesthesia:    Anesthesia method:  Topical application   Topical anesthesia: pain eaze spray. Procedure type:    Complexity:  Simple Procedure details:    Ultrasound guidance: no     Needle aspiration: no     Incision types:  Stab incision   Incision depth:  Dermal   Drainage:  Purulent   Drainage amount:  Scant   Wound treatment:  Wound left open   Packing materials:  None Post-procedure details:    Procedure completion:  Tolerated    Medications Ordered in ED Medications - No data to display  ED Course/ Medical Decision Making/ A&P                           Medical Decision Making  36 y/o male presenting for eval of paronychia to the right index finger. This was incised and drained in the ED successfully. Advised warm soaks and use of bacitracin at home. No immunosuppression that would affect wound healing. Xray neg for bony changes or concern for osteo. No signs of felon or flexor tenosynovitis. Advised on pcp f/u and strict return precautions. All questions answered, pt stable for discharge.    Final Clinical Impression(s) / ED Diagnoses Final diagnoses:  Paronychia of finger of right hand    Rx / DC Orders ED Discharge Orders     None         Rayne Du 07/07/21 0025    Nira Conn, MD 07/07/21 [phone]

## 2021-07-07 NOTE — Discharge Instructions (Signed)
Use bacitracin twice daily  Do warm soaks at least 3 times per day at home  Please follow up with your primary care provider within 5-7 days for re-evaluation of your symptoms. If you do not have a primary care provider, information for a healthcare clinic has been provided for you to make arrangements for follow up care.  Please return to the emergency room immediately if you experience any new or worsening symptoms or any symptoms that indicate worsening infection such as fevers, increased redness/swelling/pain, warmth, or drainage from the affected area.

## 2022-02-18 IMAGING — CR DG CHEST 2V
2 series · 2 of 2 positions shown · non-contrast
Comparison: 06/16/2020

CLINICAL DATA: Shortness of breath and hypertension.

EXAM:
CHEST - 2 VIEW

[chest pa]
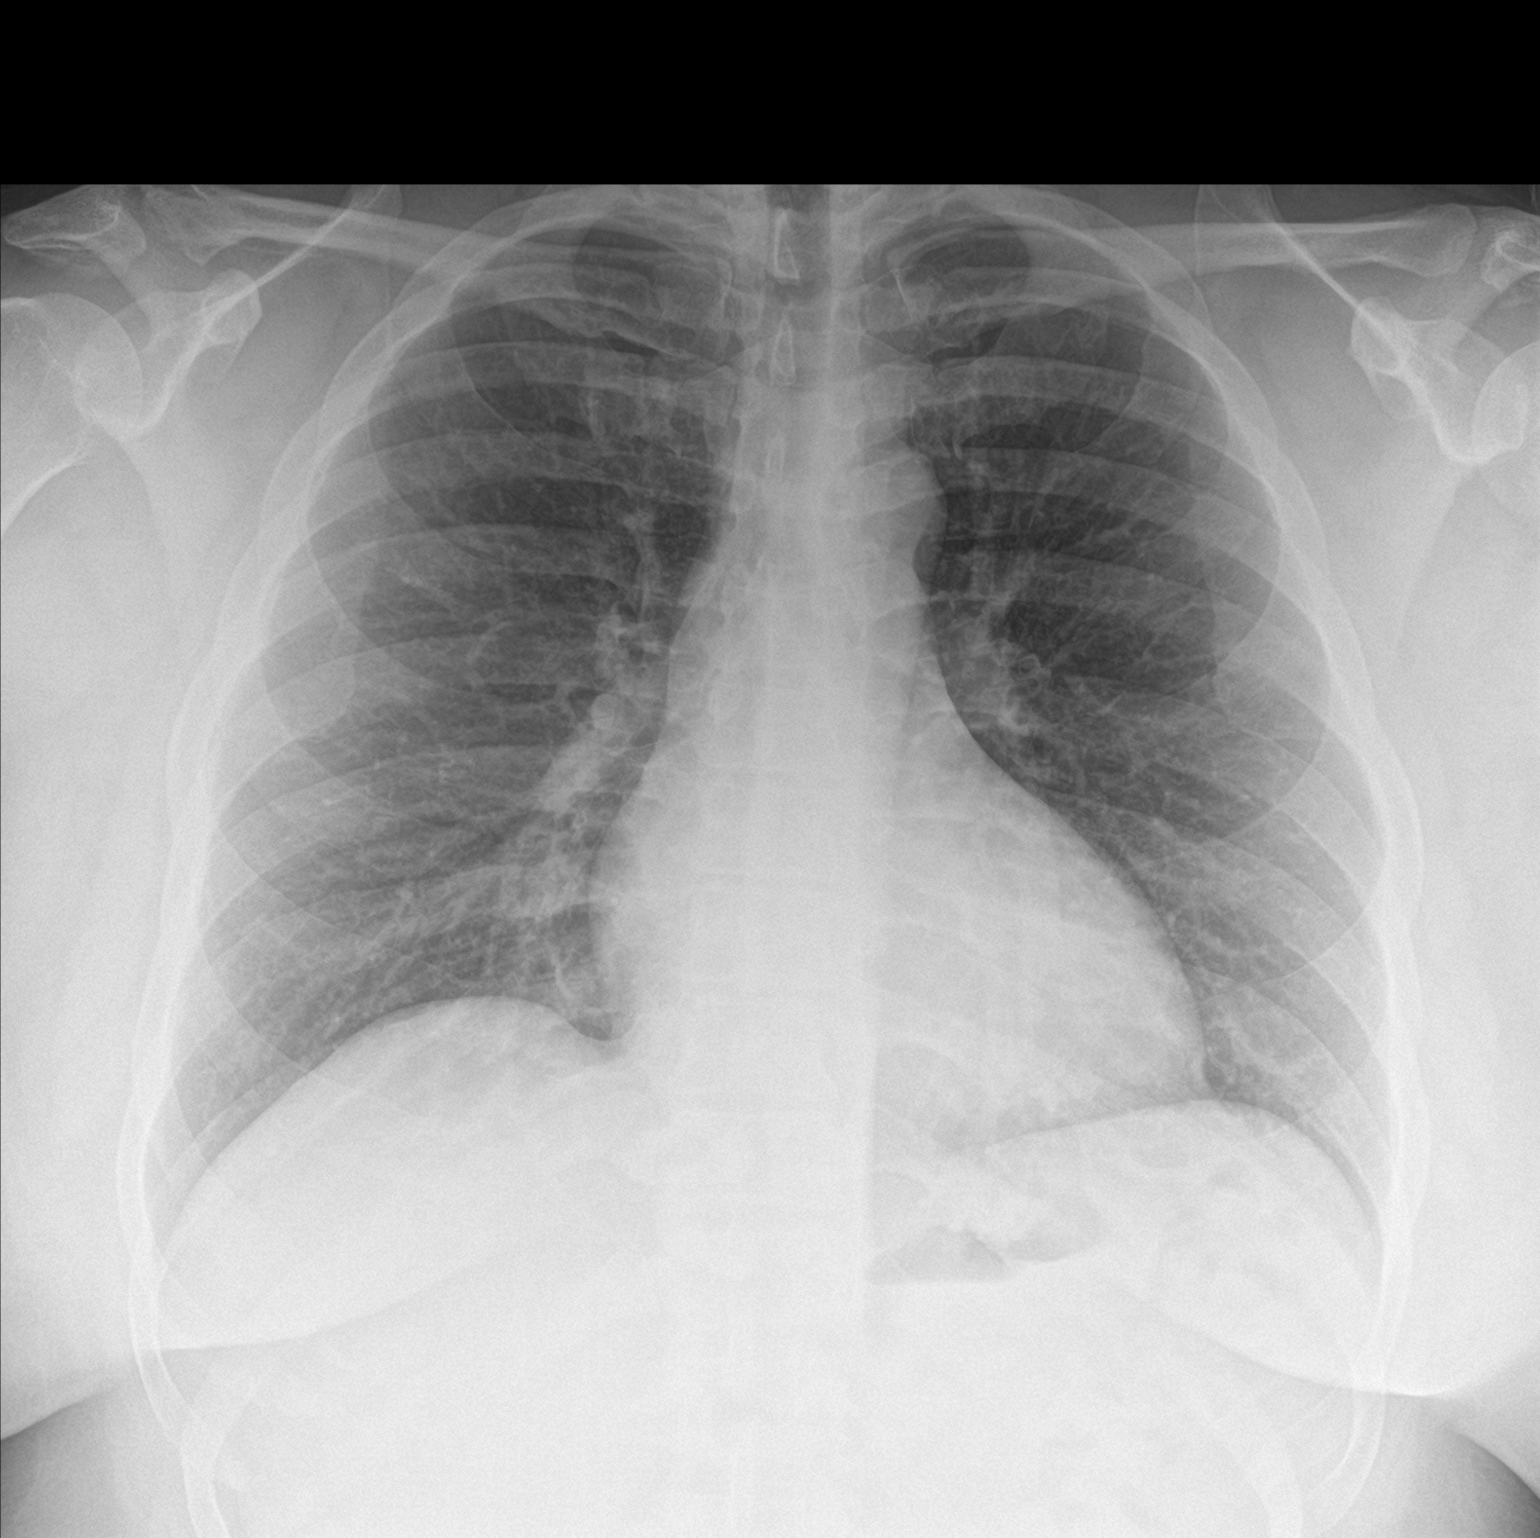

[chest lat]
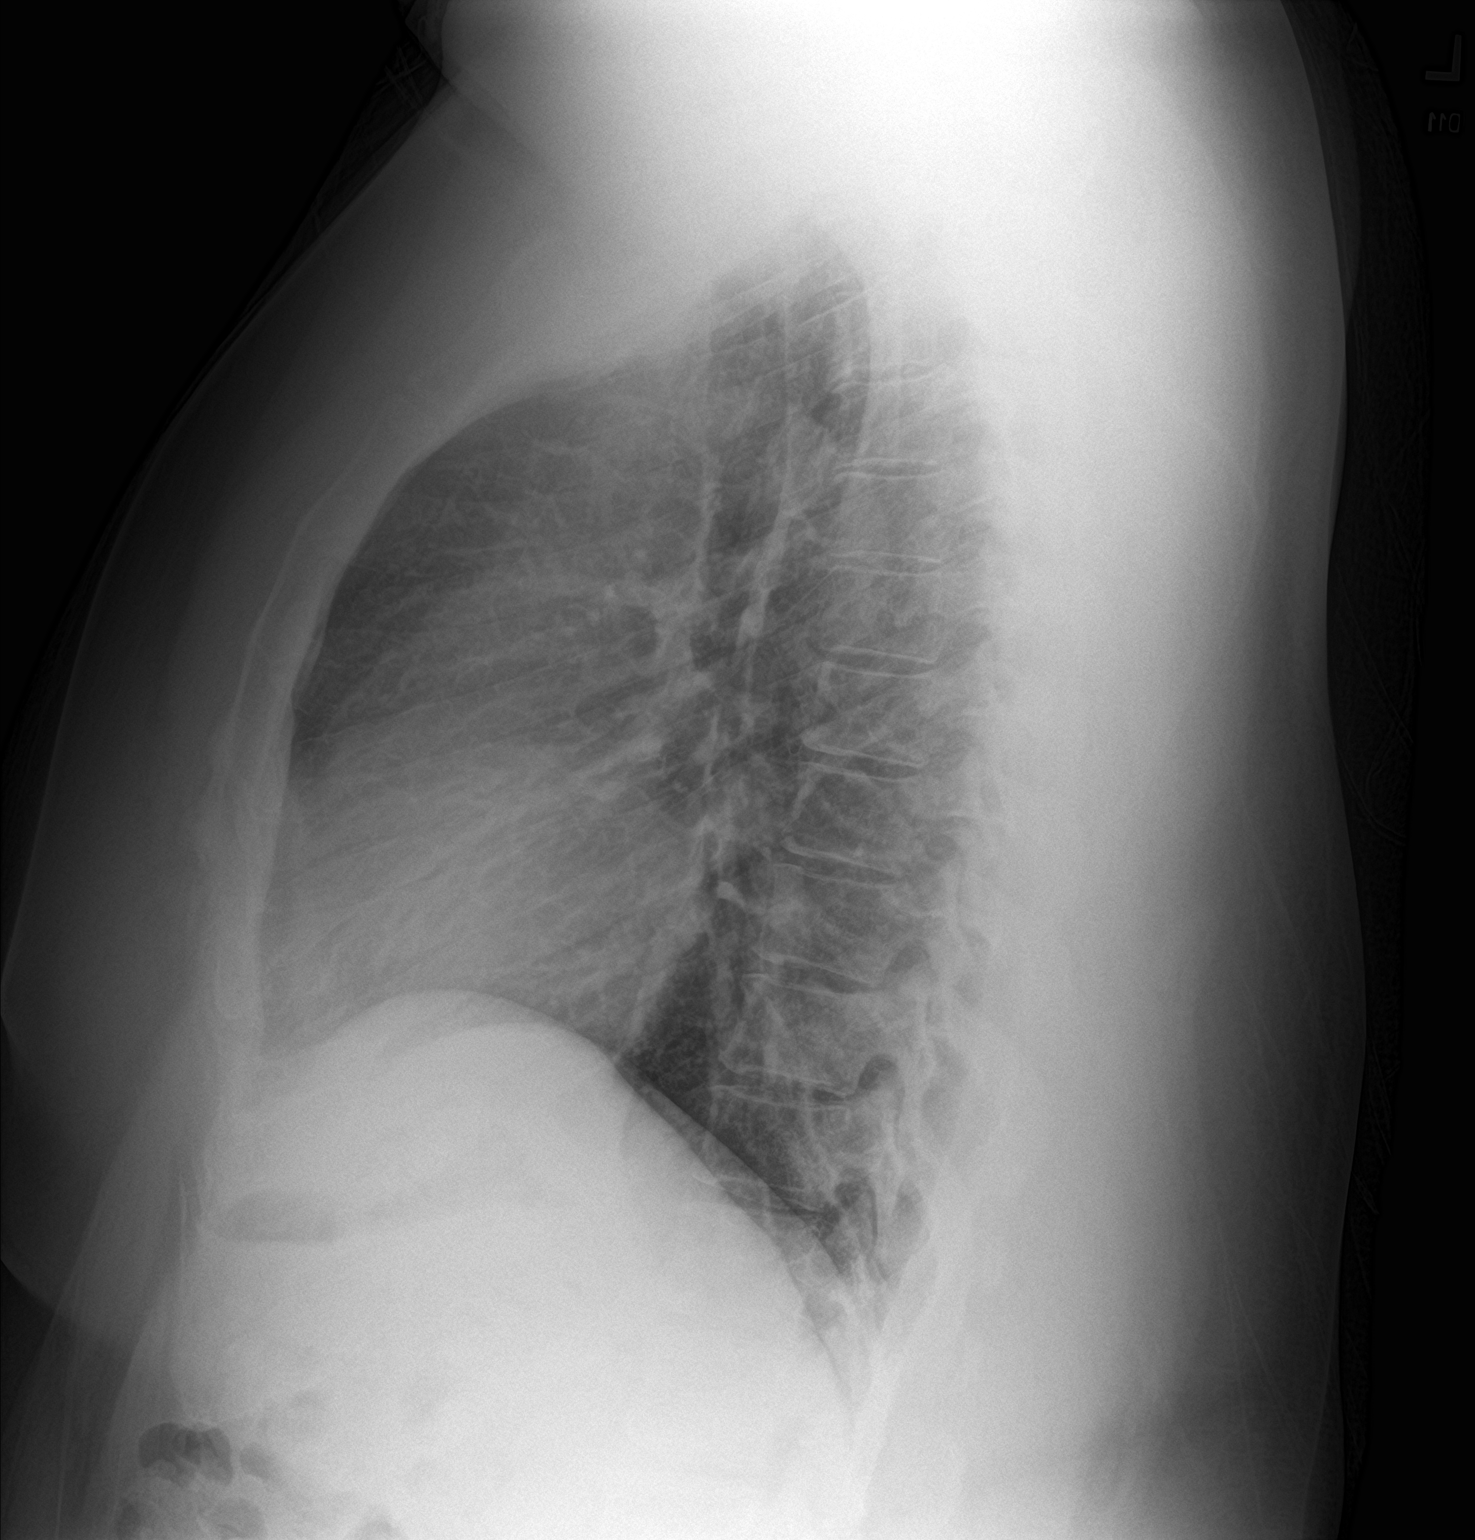

[2 of 2 positions shown; findings below may reference images not displayed]

FINDINGS: The heart size and mediastinal contours are within normal limits.
Both lungs are clear. The visualized skeletal structures are
unremarkable.
IMPRESSION: No active cardiopulmonary disease.

## 2022-07-06 IMAGING — US US ABDOMEN COMPLETE
1 series · 14 of 25 positions shown · non-contrast
Comparison: CT 08/24/2019, 05/14/2013

CLINICAL DATA: Right upper quadrant pain

EXAM:
ABDOMEN ULTRASOUND COMPLETE

[Series 1: us abdomen complete · 0.22mm/px · 14 of 88 slices shown]
[im 1/88]
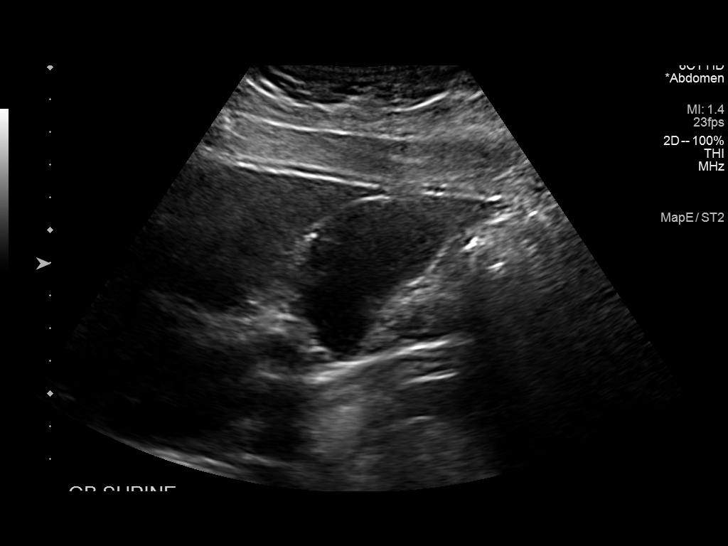
[im 8/88]
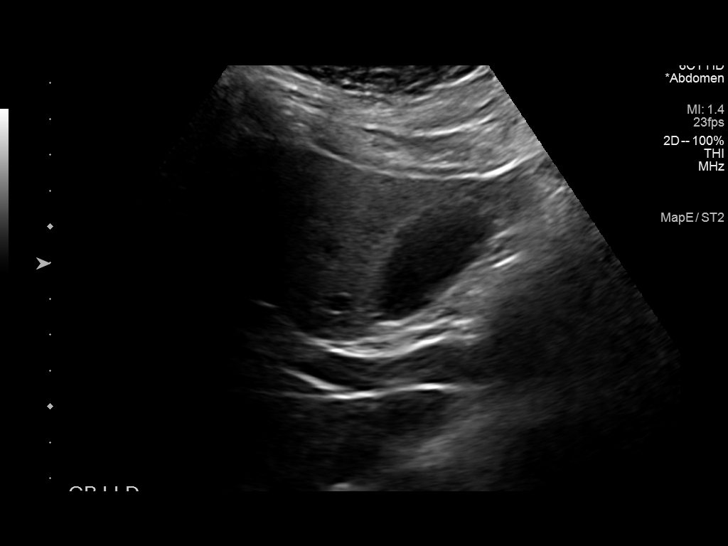
[im 15/88]
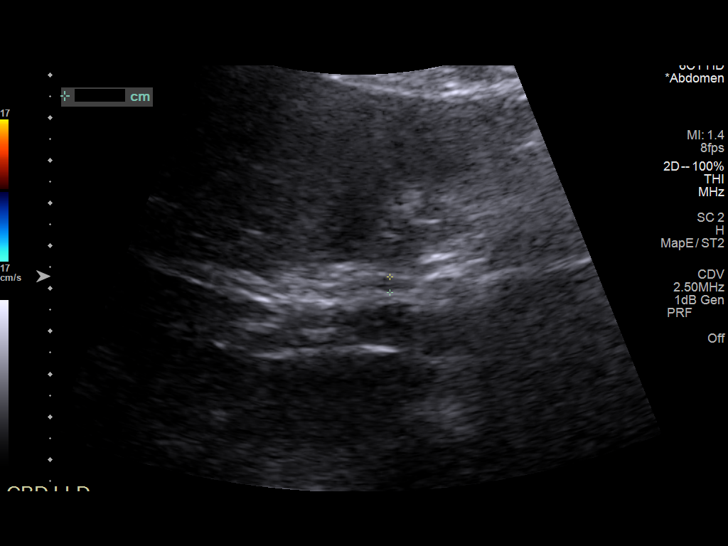
[im 22/88]
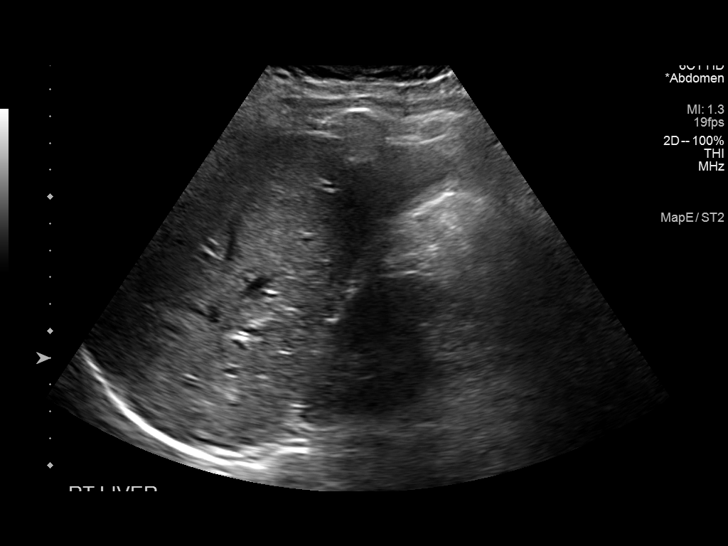
[im 30/88]
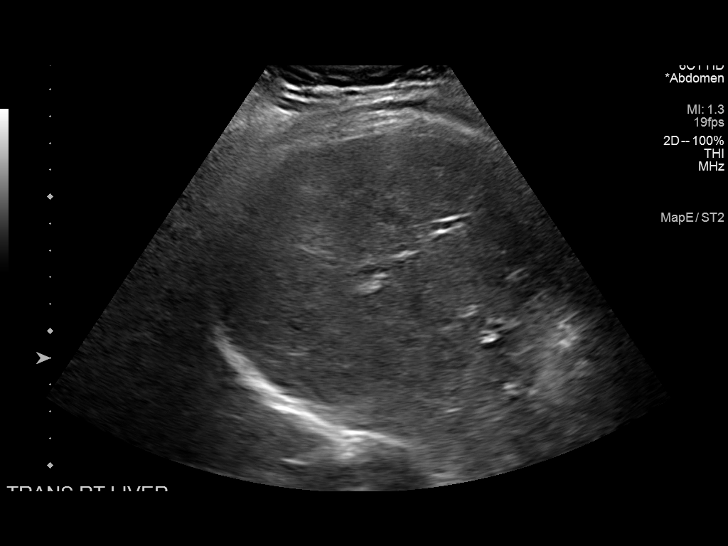
[im 33/88]
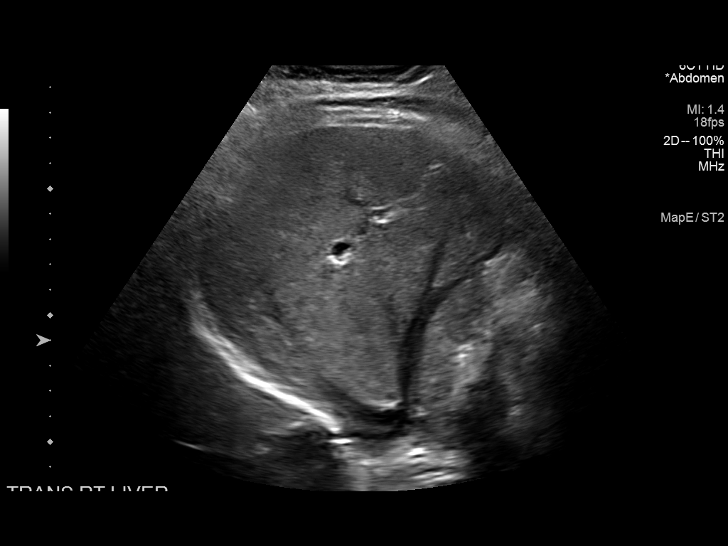
[im 40/88]
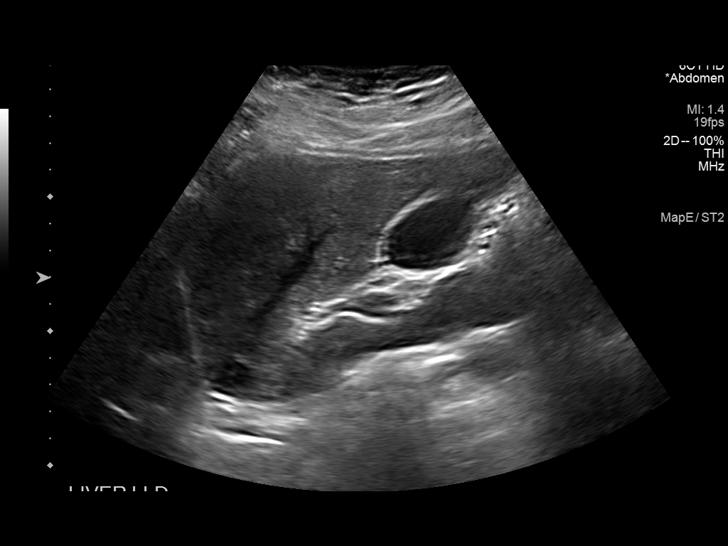
[im 48/88]
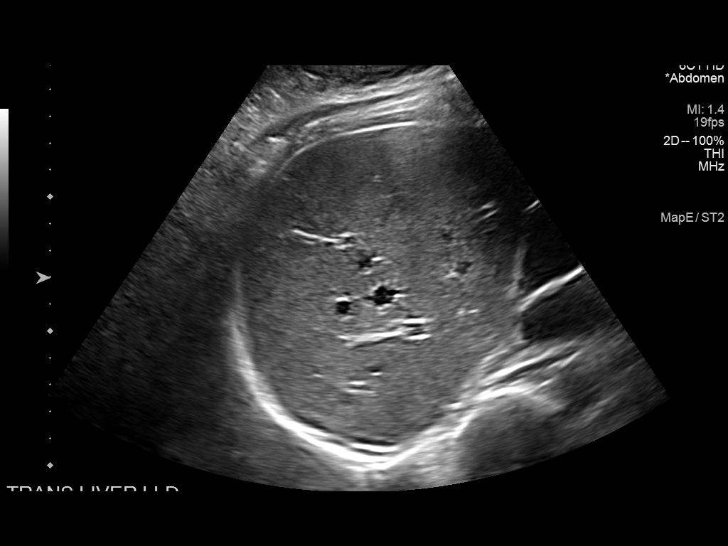
[im 55/88]
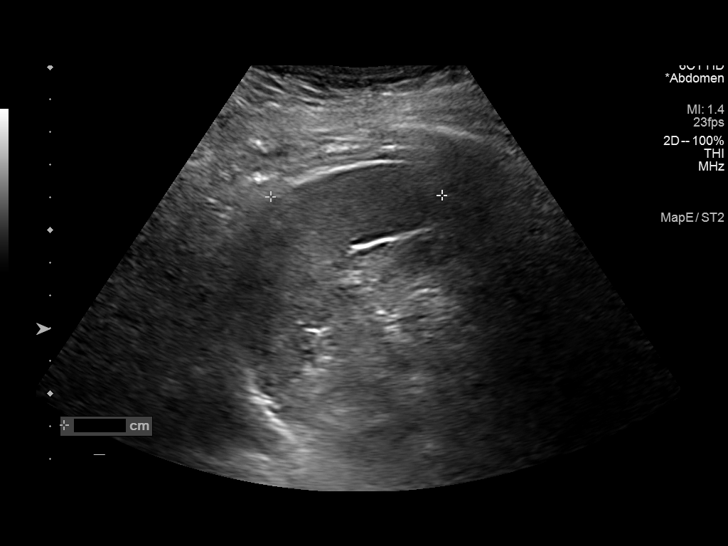
[im 59/88]
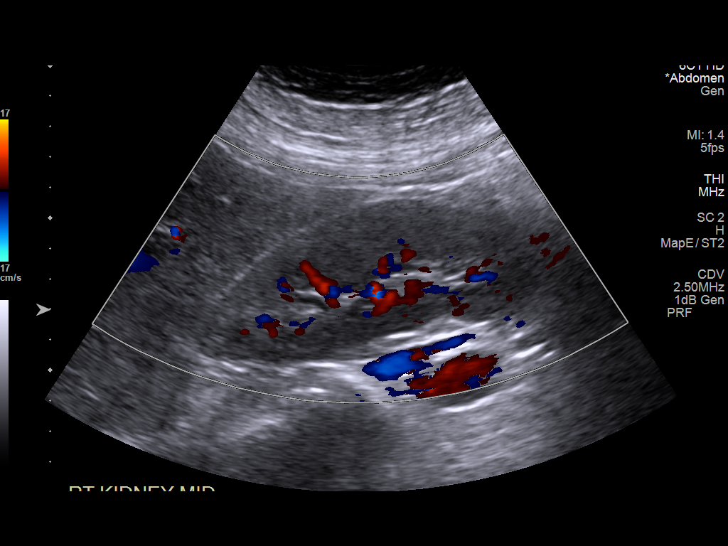
[im 66/88]
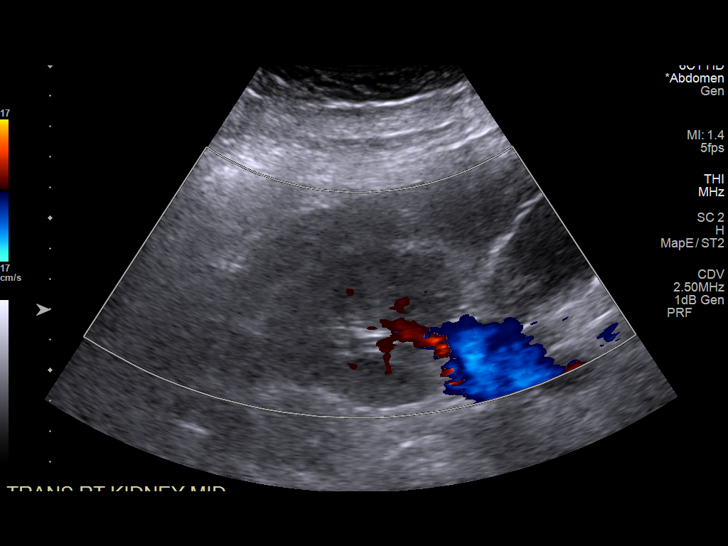
[im 73/88]
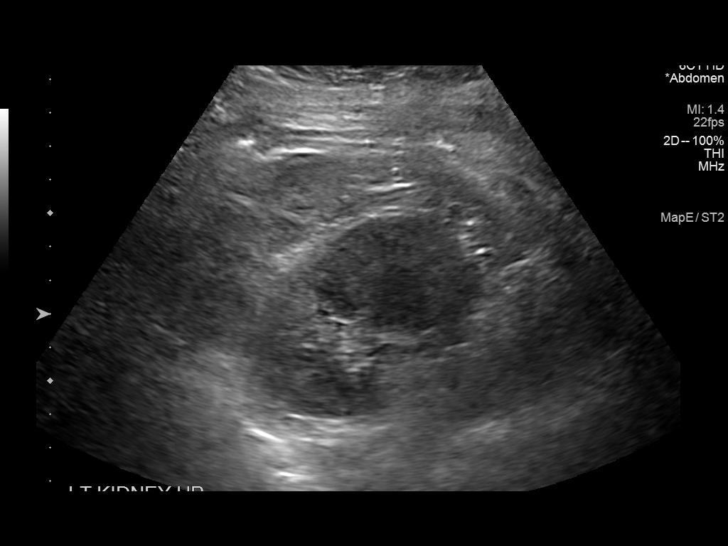
[im 80/88]
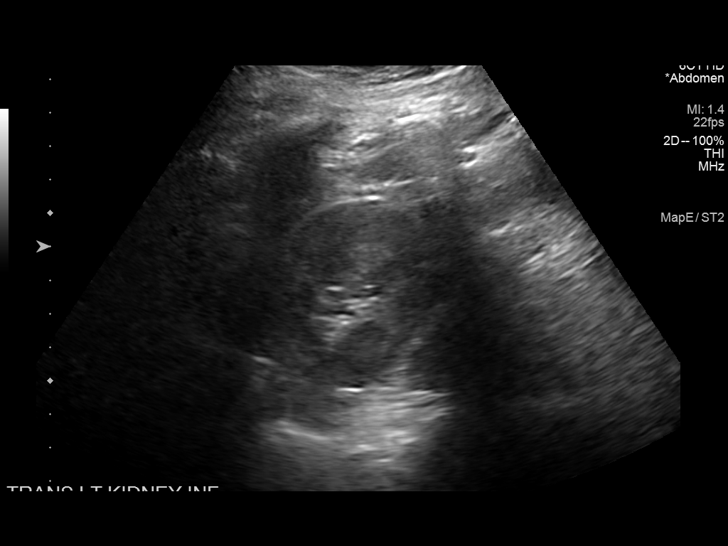
[im 88/88]
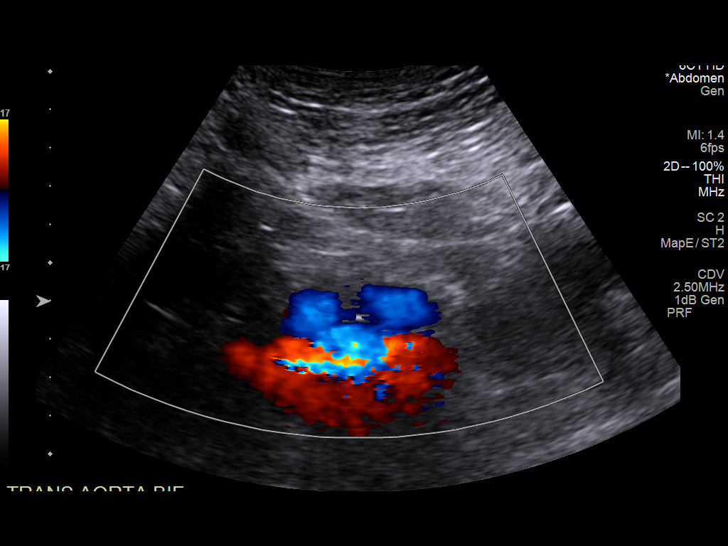

[14 of 25 positions shown; findings below may reference images not displayed]

FINDINGS: Gallbladder: No gallstones or wall thickening visualized. No
sonographic Murphy sign noted by sonographer.

Common bile duct: Diameter: 3.7 mm

Liver: No focal lesion identified. Within normal limits in
parenchymal echogenicity. Portal vein is patent on color Doppler
imaging with normal direction of blood flow towards the liver.

IVC: No abnormality visualized.

Pancreas: Visualized portion unremarkable.

Spleen: Size and appearance within normal limits.

Right Kidney: Length: 12 cm. Echogenicity within normal limits. No
mass or hydronephrosis visualized.

Left Kidney: Length: 12.1 cm. Echogenicity within normal limits. No
mass or hydronephrosis visualized.

Abdominal aorta: No aneurysm visualized.

Other findings: None.
IMPRESSION: Negative abdominal ultrasound
